# Patient Record
Sex: Female | Born: 1935
Health system: Southern US, Community
[De-identification: ages and names within clinical notes are randomized; demographics above are authoritative.]

## PROBLEM LIST (undated history)

## (undated) DIAGNOSIS — I10 Essential (primary) hypertension: Secondary | ICD-10-CM

## (undated) DIAGNOSIS — K219 Gastro-esophageal reflux disease without esophagitis: Secondary | ICD-10-CM

## (undated) HISTORY — PX: ABDOMINAL HYSTERECTOMY: SHX81

## (undated) HISTORY — PX: FOOT SURGERY: SHX648

---

## 1999-05-13 ENCOUNTER — Ambulatory Visit (HOSPITAL_COMMUNITY): Admission: RE | Admit: 1999-05-13 | Discharge: 1999-05-13 | Payer: Self-pay | Admitting: Internal Medicine

## 1999-05-13 ENCOUNTER — Encounter: Payer: Self-pay | Admitting: Internal Medicine

## 1999-05-14 ENCOUNTER — Encounter: Payer: Self-pay | Admitting: Obstetrics and Gynecology

## 1999-05-14 ENCOUNTER — Ambulatory Visit (HOSPITAL_COMMUNITY): Admission: RE | Admit: 1999-05-14 | Discharge: 1999-05-14 | Payer: Self-pay | Admitting: Obstetrics and Gynecology

## 2000-05-17 ENCOUNTER — Ambulatory Visit (HOSPITAL_COMMUNITY): Admission: RE | Admit: 2000-05-17 | Discharge: 2000-05-17 | Payer: Self-pay | Admitting: Internal Medicine

## 2000-05-17 ENCOUNTER — Encounter: Payer: Self-pay | Admitting: Internal Medicine

## 2000-08-09 ENCOUNTER — Encounter: Payer: Self-pay | Admitting: Internal Medicine

## 2000-08-09 ENCOUNTER — Encounter: Admission: RE | Admit: 2000-08-09 | Discharge: 2000-08-09 | Payer: Self-pay | Admitting: Internal Medicine

## 2003-10-15 ENCOUNTER — Ambulatory Visit (HOSPITAL_COMMUNITY): Admission: RE | Admit: 2003-10-15 | Discharge: 2003-10-15 | Payer: Self-pay | Admitting: Internal Medicine

## 2003-11-13 ENCOUNTER — Ambulatory Visit (HOSPITAL_COMMUNITY): Admission: RE | Admit: 2003-11-13 | Discharge: 2003-11-13 | Payer: Self-pay | Admitting: Internal Medicine

## 2004-01-14 ENCOUNTER — Ambulatory Visit (HOSPITAL_COMMUNITY): Admission: RE | Admit: 2004-01-14 | Discharge: 2004-01-14 | Payer: Self-pay | Admitting: Internal Medicine

## 2004-02-14 ENCOUNTER — Encounter: Admission: RE | Admit: 2004-02-14 | Discharge: 2004-02-14 | Payer: Self-pay | Admitting: Dermatology

## 2004-03-06 ENCOUNTER — Ambulatory Visit (HOSPITAL_COMMUNITY): Admission: RE | Admit: 2004-03-06 | Discharge: 2004-03-06 | Payer: Self-pay | Admitting: Gastroenterology

## 2004-12-08 ENCOUNTER — Ambulatory Visit (HOSPITAL_COMMUNITY): Admission: RE | Admit: 2004-12-08 | Discharge: 2004-12-08 | Payer: Self-pay | Admitting: Internal Medicine

## 2007-09-21 ENCOUNTER — Ambulatory Visit (HOSPITAL_COMMUNITY): Admission: RE | Admit: 2007-09-21 | Discharge: 2007-09-21 | Payer: Self-pay | Admitting: Internal Medicine

## 2008-09-21 ENCOUNTER — Ambulatory Visit (HOSPITAL_COMMUNITY): Admission: RE | Admit: 2008-09-21 | Discharge: 2008-09-21 | Payer: Self-pay | Admitting: Internal Medicine

## 2009-09-26 ENCOUNTER — Ambulatory Visit (HOSPITAL_COMMUNITY): Admission: RE | Admit: 2009-09-26 | Discharge: 2009-09-26 | Payer: Self-pay | Admitting: Internal Medicine

## 2009-11-01 ENCOUNTER — Emergency Department (HOSPITAL_COMMUNITY): Admission: EM | Admit: 2009-11-01 | Discharge: 2009-11-01 | Payer: Self-pay | Admitting: Family Medicine

## 2010-02-24 ENCOUNTER — Encounter: Payer: Self-pay | Admitting: Internal Medicine

## 2010-06-20 NOTE — Op Note (Signed)
NAME:  Kathryn Mann, Kathryn Mann                 ACCOUNT NO.:  [id number]   MEDICAL RECORD NO.:  [id number]          PATIENT TYPE:  AMB   LOCATION:  ENDO                         FACILITY:  [facility]   PHYSICIAN:  Bernette Redbird, M.D.   DATE OF BIRTH:  June 01, 1935   DATE OF PROCEDURE:  03/06/2004  DATE OF DISCHARGE:                                 OPERATIVE REPORT   PROCEDURE:  Colonoscopy.   ENDOSCOPIST:  Bernette Redbird, M.D.   INDICATIONS:  A 75 year old for colon cancer screening. She has had some  transient diarrhea recently but it has resolved.   FINDINGS:  Mild sigmoid diverticulosis and fixation.   PROCEDURE:  The nature, purposes and risks of the procedure have been  discussed with the patient and she provided written consent. Sedation was  Fentanyl 75 mcg and Versed 7.5 mg IV without arrhythmia or desaturation.  The Olympus adjustable tension pediatric video colonoscope was advanced  to the cecum, was identified by visualization of the appendiceal orifice.  Pull back was then performed. The quality of the prep was excellent and it  was felt that all areas were well seen.   The patient had some minimal sigmoid diverticulosis with associated muscular  thickening and stiffness, or fixation of the sigmoid region. In fact, there  was mild to moderate difficulty getting the scope to negotiate that on the  way in, and we had to turn the patient to the supine position to facilitate  advancement.  Otherwise, however, this was a normal examination.  No polyps,  colitis, cancer or vascular malformations were observed. In retroflexion of  the rectum, as well as reinspection of the rectum, this was unremarkable.  No biopsies were obtained.  The patient tolerated the procedure well and  there were no apparent complications.   IMPRESSION:  1.  Unremarkable screening colonoscopy (V76.51).  2.  Mild diverticulosis with associated fixation.   PLAN:  Consider flexible sigmoidoscopy in 5 years for  continued screening.      RB/MEDQ  D:  03/06/2004  T:  03/06/2004  Job:  [id number]   cc:   Wilson Singer, M.D.  [address]  [place]  [place] [address]  Fax: [phone]

## 2010-07-06 ENCOUNTER — Inpatient Hospital Stay (INDEPENDENT_AMBULATORY_CARE_PROVIDER_SITE_OTHER)
Admission: RE | Admit: 2010-07-06 | Discharge: 2010-07-06 | Disposition: A | Discharge status: Home / Self Care | Payer: Medicare Other | Source: Ambulatory Visit | Attending: Emergency Medicine | Admitting: Emergency Medicine

## 2010-07-06 DIAGNOSIS — H6120 Impacted cerumen, unspecified ear: Secondary | ICD-10-CM

## 2010-08-29 ENCOUNTER — Other Ambulatory Visit (HOSPITAL_COMMUNITY): Payer: Self-pay | Admitting: Internal Medicine

## 2010-08-29 DIAGNOSIS — Z1231 Encounter for screening mammogram for malignant neoplasm of breast: Secondary | ICD-10-CM

## 2010-09-29 ENCOUNTER — Ambulatory Visit (HOSPITAL_COMMUNITY): Payer: Medicare Other

## 2010-10-08 ENCOUNTER — Ambulatory Visit (HOSPITAL_COMMUNITY)
Admission: RE | Admit: 2010-10-08 | Discharge: 2010-10-08 | Disposition: A | Discharge status: Home / Self Care | Payer: Medicare Other | Source: Ambulatory Visit | Attending: Internal Medicine | Admitting: Internal Medicine

## 2010-10-08 DIAGNOSIS — Z1231 Encounter for screening mammogram for malignant neoplasm of breast: Secondary | ICD-10-CM

## 2011-03-13 DIAGNOSIS — L570 Actinic keratosis: Secondary | ICD-10-CM | POA: Diagnosis not present

## 2011-03-13 DIAGNOSIS — L578 Other skin changes due to chronic exposure to nonionizing radiation: Secondary | ICD-10-CM | POA: Diagnosis not present

## 2011-04-03 DIAGNOSIS — H04129 Dry eye syndrome of unspecified lacrimal gland: Secondary | ICD-10-CM | POA: Diagnosis not present

## 2011-04-03 DIAGNOSIS — H5200 Hypermetropia, unspecified eye: Secondary | ICD-10-CM | POA: Diagnosis not present

## 2011-04-03 DIAGNOSIS — H52209 Unspecified astigmatism, unspecified eye: Secondary | ICD-10-CM | POA: Diagnosis not present

## 2011-04-03 DIAGNOSIS — H2510 Age-related nuclear cataract, unspecified eye: Secondary | ICD-10-CM | POA: Diagnosis not present

## 2011-04-06 DIAGNOSIS — B029 Zoster without complications: Secondary | ICD-10-CM | POA: Diagnosis not present

## 2011-07-07 DIAGNOSIS — L259 Unspecified contact dermatitis, unspecified cause: Secondary | ICD-10-CM | POA: Diagnosis not present

## 2011-09-11 ENCOUNTER — Other Ambulatory Visit (HOSPITAL_COMMUNITY): Payer: Self-pay | Admitting: Internal Medicine

## 2011-09-11 DIAGNOSIS — Z1231 Encounter for screening mammogram for malignant neoplasm of breast: Secondary | ICD-10-CM

## 2011-09-26 ENCOUNTER — Encounter (HOSPITAL_COMMUNITY): Payer: Self-pay | Admitting: *Deleted

## 2011-09-26 ENCOUNTER — Emergency Department (INDEPENDENT_AMBULATORY_CARE_PROVIDER_SITE_OTHER)
Admission: EM | Admit: 2011-09-26 | Discharge: 2011-09-26 | Disposition: A | Discharge status: Home / Self Care | Payer: Medicare Other | Source: Home / Self Care | Attending: Emergency Medicine | Admitting: Emergency Medicine

## 2011-09-26 DIAGNOSIS — H6090 Unspecified otitis externa, unspecified ear: Secondary | ICD-10-CM

## 2011-09-26 DIAGNOSIS — H6120 Impacted cerumen, unspecified ear: Secondary | ICD-10-CM

## 2011-09-26 HISTORY — DX: Essential (primary) hypertension: I10

## 2011-09-26 HISTORY — DX: Gastro-esophageal reflux disease without esophagitis: K21.9

## 2011-09-26 NOTE — ED Notes (Signed)
Pt states " my ear is plugged up.It doesn't hurtm it's just uncomfortable and it is hard to hear". States that is happened before and wax needed to be cleaned out and that she frequently flys aot when traveling.

## 2011-09-26 NOTE — ED Provider Notes (Signed)
Chief Complaint  Patient presents with  . Otalgia    History of Present Illness:   Kathryn Mann is a 76 year old female who has had right ear pain since last night. She feels ear to be congested. She has had problem with cerumen impactions before. She denies any nasal congestion, rhinorrhea, sore throat, fever, or cough.  Review of Systems:  Other than noted above, the patient denies any of the following symptoms: Systemic:  No fevers, chills, sweats, weight loss or gain, fatigue, or tiredness. Eye:  No redness, pain, discharge, itching, blurred vision, or diplopia. ENT:  No headache, nasal congestion, sneezing, itching, epistaxis, ear pain, congestion, decreased hearing, ringing in ears, vertigo, or tinnitus.  No oral lesions, sore throat, pain on swallowing, or hoarseness. Neck:  No mass, tenderness or adenopathy. Lungs:  No coughing, wheezing, or shortness of breath. Skin:  No rash or itching.  PMFSH:  Past medical history, family history, social history, meds, and allergies were reviewed.  Physical Exam:   Vital signs:  BP 189/98  Pulse 68  Temp 98 F (36.7 C) (Oral)  Resp 18  SpO2 98% General:  Alert and oriented.  In no distress.  Skin warm and dry. Eye:  PERRL, full EOMs, lids and conjunctiva normal.   ENT:  The right ear canal was swollen, red, inflamed. There was a moderate amount of cerumen present. This was irrigated clear. Thereafter the TM appeared normal but the ear canal is still swollen and inflamed. The left ear canal appear normal. There was a moderate amount of cerumen present on the left as well and this was irrigated clear and thereafter the canal TM appeared normal..  Nasal mucosa not congested and without drainage.  Mucous membranes moist, no oral lesions, normal dentition, pharynx clear.  No cranial or facial pain to palplation. Neck:  Supple, full ROM.  No adenopathy, tenderness or mass.  Thyroid normal. Skin:  Clear, warm and dry.  Course in Urgent Care Center:   An  ear wick was inserted and 3 drops of Cortisporin otic suspension were instilled. She was told to continue to use the Cortisporin drops at home, 3 drops 4 times daily and return again as needed.  Assessment:  The primary encounter diagnosis was Cerumen impaction. A diagnosis of Otitis externa was also pertinent to this visit.  Plan:   1.  The following meds were prescribed:   New Prescriptions   No medications on file   2.  The patient was instructed in symptomatic care and handouts were given. 3.  The patient was told to return if becoming worse in any way, if no better in 3 or 4 days, and given some red flag symptoms that would indicate earlier return.  Follow up:  The patient was told to follow up here if no better in 3 or 4 days.       Reuben Likes, MD 09/26/11 [phone]

## 2011-09-28 DIAGNOSIS — H9209 Otalgia, unspecified ear: Secondary | ICD-10-CM | POA: Diagnosis not present

## 2011-09-28 DIAGNOSIS — I10 Essential (primary) hypertension: Secondary | ICD-10-CM | POA: Diagnosis not present

## 2011-09-28 DIAGNOSIS — H6500 Acute serous otitis media, unspecified ear: Secondary | ICD-10-CM | POA: Diagnosis not present

## 2011-09-29 DIAGNOSIS — H9209 Otalgia, unspecified ear: Secondary | ICD-10-CM | POA: Diagnosis not present

## 2011-09-29 DIAGNOSIS — H60399 Other infective otitis externa, unspecified ear: Secondary | ICD-10-CM | POA: Diagnosis not present

## 2011-10-02 DIAGNOSIS — H60399 Other infective otitis externa, unspecified ear: Secondary | ICD-10-CM | POA: Diagnosis not present

## 2011-10-09 ENCOUNTER — Ambulatory Visit (HOSPITAL_COMMUNITY)
Admission: RE | Admit: 2011-10-09 | Discharge: 2011-10-09 | Disposition: A | Discharge status: Home / Self Care | Payer: Medicare Other | Source: Ambulatory Visit | Attending: Internal Medicine | Admitting: Internal Medicine

## 2011-10-09 DIAGNOSIS — Z1231 Encounter for screening mammogram for malignant neoplasm of breast: Secondary | ICD-10-CM | POA: Insufficient documentation

## 2011-12-22 DIAGNOSIS — L819 Disorder of pigmentation, unspecified: Secondary | ICD-10-CM | POA: Diagnosis not present

## 2011-12-22 DIAGNOSIS — L408 Other psoriasis: Secondary | ICD-10-CM | POA: Diagnosis not present

## 2011-12-22 DIAGNOSIS — H6120 Impacted cerumen, unspecified ear: Secondary | ICD-10-CM | POA: Diagnosis not present

## 2012-01-04 DIAGNOSIS — Z23 Encounter for immunization: Secondary | ICD-10-CM | POA: Diagnosis not present

## 2012-06-21 DIAGNOSIS — R82998 Other abnormal findings in urine: Secondary | ICD-10-CM | POA: Diagnosis not present

## 2012-06-21 DIAGNOSIS — R300 Dysuria: Secondary | ICD-10-CM | POA: Diagnosis not present

## 2012-08-03 DIAGNOSIS — M949 Disorder of cartilage, unspecified: Secondary | ICD-10-CM | POA: Diagnosis not present

## 2012-08-03 DIAGNOSIS — E559 Vitamin D deficiency, unspecified: Secondary | ICD-10-CM | POA: Diagnosis not present

## 2012-08-03 DIAGNOSIS — Z1331 Encounter for screening for depression: Secondary | ICD-10-CM | POA: Diagnosis not present

## 2012-08-03 DIAGNOSIS — E785 Hyperlipidemia, unspecified: Secondary | ICD-10-CM | POA: Diagnosis not present

## 2012-08-03 DIAGNOSIS — I10 Essential (primary) hypertension: Secondary | ICD-10-CM | POA: Diagnosis not present

## 2012-08-03 DIAGNOSIS — Z6828 Body mass index (BMI) 28.0-28.9, adult: Secondary | ICD-10-CM | POA: Diagnosis not present

## 2012-10-12 ENCOUNTER — Other Ambulatory Visit (HOSPITAL_COMMUNITY): Payer: Self-pay | Admitting: Internal Medicine

## 2012-10-12 DIAGNOSIS — Z1231 Encounter for screening mammogram for malignant neoplasm of breast: Secondary | ICD-10-CM

## 2012-10-13 ENCOUNTER — Ambulatory Visit (HOSPITAL_COMMUNITY)
Admission: RE | Admit: 2012-10-13 | Discharge: 2012-10-13 | Disposition: A | Discharge status: Home / Self Care | Payer: Medicare Other | Source: Ambulatory Visit | Attending: Internal Medicine | Admitting: Internal Medicine

## 2012-10-13 DIAGNOSIS — Z1231 Encounter for screening mammogram for malignant neoplasm of breast: Secondary | ICD-10-CM | POA: Insufficient documentation

## 2013-01-24 DIAGNOSIS — L02419 Cutaneous abscess of limb, unspecified: Secondary | ICD-10-CM | POA: Diagnosis not present

## 2013-04-21 DIAGNOSIS — H6120 Impacted cerumen, unspecified ear: Secondary | ICD-10-CM | POA: Diagnosis not present

## 2013-07-31 DIAGNOSIS — E785 Hyperlipidemia, unspecified: Secondary | ICD-10-CM | POA: Diagnosis not present

## 2013-07-31 DIAGNOSIS — I10 Essential (primary) hypertension: Secondary | ICD-10-CM | POA: Diagnosis not present

## 2013-07-31 DIAGNOSIS — E559 Vitamin D deficiency, unspecified: Secondary | ICD-10-CM | POA: Diagnosis not present

## 2013-08-01 DIAGNOSIS — R82998 Other abnormal findings in urine: Secondary | ICD-10-CM | POA: Diagnosis not present

## 2013-08-07 DIAGNOSIS — M899 Disorder of bone, unspecified: Secondary | ICD-10-CM | POA: Diagnosis not present

## 2013-08-07 DIAGNOSIS — Z0000 Encounter for general adult medical examination without abnormal findings: Secondary | ICD-10-CM | POA: Diagnosis not present

## 2013-08-07 DIAGNOSIS — N390 Urinary tract infection, site not specified: Secondary | ICD-10-CM | POA: Diagnosis not present

## 2013-08-07 DIAGNOSIS — I10 Essential (primary) hypertension: Secondary | ICD-10-CM | POA: Diagnosis not present

## 2013-08-07 DIAGNOSIS — K219 Gastro-esophageal reflux disease without esophagitis: Secondary | ICD-10-CM | POA: Diagnosis not present

## 2013-08-07 DIAGNOSIS — Z1331 Encounter for screening for depression: Secondary | ICD-10-CM | POA: Diagnosis not present

## 2013-08-07 DIAGNOSIS — E559 Vitamin D deficiency, unspecified: Secondary | ICD-10-CM | POA: Diagnosis not present

## 2013-08-07 DIAGNOSIS — R030 Elevated blood-pressure reading, without diagnosis of hypertension: Secondary | ICD-10-CM | POA: Diagnosis not present

## 2013-08-07 DIAGNOSIS — E785 Hyperlipidemia, unspecified: Secondary | ICD-10-CM | POA: Diagnosis not present

## 2013-08-08 DIAGNOSIS — Z1212 Encounter for screening for malignant neoplasm of rectum: Secondary | ICD-10-CM | POA: Diagnosis not present

## 2013-09-20 DIAGNOSIS — M899 Disorder of bone, unspecified: Secondary | ICD-10-CM | POA: Diagnosis not present

## 2013-09-20 DIAGNOSIS — M949 Disorder of cartilage, unspecified: Secondary | ICD-10-CM | POA: Diagnosis not present

## 2013-09-20 DIAGNOSIS — E559 Vitamin D deficiency, unspecified: Secondary | ICD-10-CM | POA: Diagnosis not present

## 2013-11-06 DIAGNOSIS — Z23 Encounter for immunization: Secondary | ICD-10-CM | POA: Diagnosis not present

## 2014-02-16 DIAGNOSIS — L720 Epidermal cyst: Secondary | ICD-10-CM | POA: Diagnosis not present

## 2014-02-26 ENCOUNTER — Other Ambulatory Visit: Payer: Self-pay | Admitting: Podiatry

## 2014-02-26 ENCOUNTER — Ambulatory Visit (INDEPENDENT_AMBULATORY_CARE_PROVIDER_SITE_OTHER): Payer: Medicare Other | Admitting: Podiatry

## 2014-02-26 ENCOUNTER — Encounter: Payer: Self-pay | Admitting: Podiatry

## 2014-02-26 ENCOUNTER — Ambulatory Visit (INDEPENDENT_AMBULATORY_CARE_PROVIDER_SITE_OTHER): Payer: Medicare Other

## 2014-02-26 VITALS — BP 127/52 | HR 68 | Resp 16

## 2014-02-26 DIAGNOSIS — M779 Enthesopathy, unspecified: Secondary | ICD-10-CM | POA: Diagnosis not present

## 2014-02-26 DIAGNOSIS — M2042 Other hammer toe(s) (acquired), left foot: Secondary | ICD-10-CM | POA: Diagnosis not present

## 2014-02-26 DIAGNOSIS — M216X1 Other acquired deformities of right foot: Secondary | ICD-10-CM

## 2014-02-26 DIAGNOSIS — M2040 Other hammer toe(s) (acquired), unspecified foot: Secondary | ICD-10-CM | POA: Diagnosis not present

## 2014-02-26 NOTE — Progress Notes (Signed)
   Subjective:    Patient ID: Kathryn Mann, female    DOB: Jun 03, 1935, 79 y.o.   MRN: [id number]  HPI Comments: "I have callus that he has looked at before"  Patient c/o tender callused area plantar forefoot right for several years. Sore when walking. Needs it trimmed.     Review of Systems  All other systems reviewed and are negative.      Objective:   Physical Exam        Assessment & Plan:

## 2014-02-26 NOTE — Patient Instructions (Signed)
Pre-Operative Instructions  Congratulations, you have decided to take an important step to improving your quality of life.  You can be assured that the doctors of [facility] will be with you every step of the way.  1. Plan to be at the surgery center/hospital at least 1 (one) hour prior to your scheduled time unless otherwise directed by the surgical center/hospital staff.  You must have a responsible adult accompany you, remain during the surgery and drive you home.  Make sure you have directions to the surgical center/hospital and know how to get there on time. 2. For hospital based surgery you will need to obtain a history and physical form from your family physician within 1 month prior to the date of surgery- we will give you a form for you primary physician.  3. We make every effort to accommodate the date you request for surgery.  There are however, times where surgery dates or times have to be moved.  We will contact you as soon as possible if a change in schedule is required.   4. No Aspirin/Ibuprofen for one week before surgery.  If you are on aspirin, any non-steroidal anti-inflammatory medications (Mobic, Aleve, Ibuprofen) you should stop taking it 7 days prior to your surgery.  You make take Tylenol  For pain prior to surgery.  5. Medications- If you are taking daily heart and blood pressure medications, seizure, reflux, allergy, asthma, anxiety, pain or diabetes medications, make sure the surgery center/hospital is aware before the day of surgery so they may notify you which medications to take or avoid the day of surgery. 6. No food or drink after midnight the night before surgery unless directed otherwise by surgical center/hospital staff. 7. No alcoholic beverages 24 hours prior to surgery.  No smoking 24 hours prior to or 24 hours after surgery. 8. Wear loose pants or shorts- loose enough to fit over bandages, boots, and casts. 9. No slip on shoes, sneakers are best. 10. Bring  your boot with you to the surgery center/hospital.  Also bring crutches or a walker if your physician has prescribed it for you.  If you do not have this equipment, it will be provided for you after surgery. 11. If you have not been contracted by the surgery center/hospital by the day before your surgery, call to confirm the date and time of your surgery. 12. Leave-time from work may vary depending on the type of surgery you have.  Appropriate arrangements should be made prior to surgery with your employer. 13. Prescriptions will be provided immediately following surgery by your doctor.  Have these filled as soon as possible after surgery and take the medication as directed. 14. Remove nail polish on the operative foot. 15. Wash the night before surgery.  The night before surgery wash the foot and leg well with the antibacterial soap provided and water paying special attention to beneath the toenails and in between the toes.  Rinse thoroughly with water and dry well with a towel.  Perform this wash unless told not to do so by your physician.  Enclosed: 1 Ice pack (please put in freezer the night before surgery)   1 Hibiclens skin cleaner   Pre-op Instructions  If you have any questions regarding the instructions, do not hesitate to call our office.  [place]: [address] [place], [place] [address] [phone]  [place]: [address] [place], [place] [address] [phone]  [place]: [address]  [place],  [address] [phone]  Dr. Richard   Tuchman DPM, Dr. Norman Regal DPM Dr. Richard Sikora DPM, Dr. M. Todd Hyatt DPM, Dr. Kathryn Egerton DPM 

## 2014-02-28 NOTE — Progress Notes (Signed)
Subjective:     Patient ID: Kathryn Mann, female   DOB: July 20, 1935, 79 y.o.   MRN: [id number]  HPI patient states this right foot is still killing me and I feel like I'm walking directly on the bone and I'm having trouble being able to ambulate or do any types of physical activity or wear shoe gear comfortably   Review of Systems  All other systems reviewed and are negative.      Objective:   Physical Exam  Constitutional: She is oriented to person, place, and time.  Cardiovascular: Intact distal pulses.   Musculoskeletal: Normal range of motion.  Neurological: She is oriented to person, place, and time.  Skin: Skin is warm.  Nursing note and vitals reviewed.  neurovascular status intact with muscle strength adequate and range of motion of the subtalar midtarsal joint within normal limits. Patient has extensive forefoot surgery and is noted to have mild medial dislocation of the left hallux and incision sites were bunion some corrected on both feet along with digital corrections. She is found to have a very prominent third metatarsal right that's very tender when pressed with a dorsally dislocated third toe and does have on the fifth metatarsal a prominent area and redness around the fifth metatarsal head that's painful when pressed difficult. 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 At this point I did discuss the difficult dislocation of the third MPJ and the fact that the bone is being directly plantarflexed secondary to structure. I reviewed different treatment options  and she wants and aggressive treatment to try to get this better and I discussed relocating the third digit and shortening the third metatarsal with the fact that due to the extreme deformity that most likely will not be able to get complete relocation of the toe. Also have recommended correcting the fifth metatarsal and did discuss again that this is a difficult case and there is no guarantee as to what kind results we can achieve. Patient wants to do surgery and at this time I allowed her to read a consent form for correction explaining all possible complications as outlined and the fact there is no long-term guarantees. She wants surgery and signs consent form after extensive review and is scheduled for outpatient surgery with a short air fracture walker dispensed today for the postoperative period that I want her to get used to prior to surgery. She is given all preoperative instructions and is encouraged to call with any questions that may come up prior to procedure

## 2014-03-21 ENCOUNTER — Ambulatory Visit (INDEPENDENT_AMBULATORY_CARE_PROVIDER_SITE_OTHER): Payer: Medicare Other | Admitting: Podiatry

## 2014-03-21 ENCOUNTER — Encounter: Payer: Self-pay | Admitting: Podiatry

## 2014-03-21 DIAGNOSIS — M779 Enthesopathy, unspecified: Secondary | ICD-10-CM | POA: Diagnosis not present

## 2014-03-21 DIAGNOSIS — M2040 Other hammer toe(s) (acquired), unspecified foot: Secondary | ICD-10-CM

## 2014-03-21 DIAGNOSIS — M216X1 Other acquired deformities of right foot: Secondary | ICD-10-CM

## 2014-03-21 NOTE — Progress Notes (Signed)
Subjective:     Patient ID: Kathryn Mann, female   DOB: 1936/01/29, 79 y.o.   MRN: [id number]  HPI patient states I want to hold off on fixing my left foot and I just wanted to the right one and I wanted to review what work in a do and get my questions answered   Review of Systems     Objective:   Physical Exam Neurovascular status unchanged with patient healthy history being good and no changes noted. Patient's digits are well-perfused and she's well oriented 3 and is found to have severe plantar lesion third metatarsal right with elevation of the digit and large hyperostosis fifth metatarsal right with digital deformity second left    Assessment:     Structural deformities noted as we had noted prior    Plan:     Reviewed condition at great length and I explained again the difficulty of lengthening the second toe and reducing the dislocation of the cyst third metatarsal phalangeal joint. At this time she wants surgery and is again as scheduled and reviewed and also understand metatarsal osteotomy fifth right and a one point in the future we will fix the left foot

## 2014-03-27 ENCOUNTER — Encounter: Payer: Self-pay | Admitting: Podiatry

## 2014-03-27 DIAGNOSIS — M216X1 Other acquired deformities of right foot: Secondary | ICD-10-CM | POA: Diagnosis not present

## 2014-03-27 DIAGNOSIS — M205X1 Other deformities of toe(s) (acquired), right foot: Secondary | ICD-10-CM | POA: Diagnosis not present

## 2014-03-27 DIAGNOSIS — M2041 Other hammer toe(s) (acquired), right foot: Secondary | ICD-10-CM | POA: Diagnosis not present

## 2014-03-27 DIAGNOSIS — M21541 Acquired clubfoot, right foot: Secondary | ICD-10-CM | POA: Diagnosis not present

## 2014-03-27 DIAGNOSIS — M2011 Hallux valgus (acquired), right foot: Secondary | ICD-10-CM | POA: Diagnosis not present

## 2014-03-27 DIAGNOSIS — I10 Essential (primary) hypertension: Secondary | ICD-10-CM | POA: Diagnosis not present

## 2014-03-27 DIAGNOSIS — M21549 Acquired clubfoot, unspecified foot: Secondary | ICD-10-CM | POA: Diagnosis not present

## 2014-03-27 DIAGNOSIS — M2010 Hallux valgus (acquired), unspecified foot: Secondary | ICD-10-CM | POA: Diagnosis not present

## 2014-04-02 ENCOUNTER — Ambulatory Visit (INDEPENDENT_AMBULATORY_CARE_PROVIDER_SITE_OTHER): Payer: Medicare Other

## 2014-04-02 ENCOUNTER — Ambulatory Visit (INDEPENDENT_AMBULATORY_CARE_PROVIDER_SITE_OTHER): Payer: Medicare Other | Admitting: Podiatry

## 2014-04-02 DIAGNOSIS — M2040 Other hammer toe(s) (acquired), unspecified foot: Secondary | ICD-10-CM

## 2014-04-02 DIAGNOSIS — M216X1 Other acquired deformities of right foot: Secondary | ICD-10-CM

## 2014-04-02 DIAGNOSIS — Z9889 Other specified postprocedural states: Secondary | ICD-10-CM | POA: Diagnosis not present

## 2014-04-03 NOTE — Progress Notes (Signed)
Subjective:     Patient ID: Kathryn Mann, female   DOB: [birth year], 79 y.o.   MRN: [id number]  HPI patient states my right foot feels real good with minimal swelling or pain and I'm able to walk without any problems currently   Review of Systems     Objective:   Physical Exam Neurovascular status intact with muscle strength adequate negative Homans sign noted and wound edges that are well coapted with good alignment and pin in place  third toe    Assessment:     Right foot that's doing well with pin in place and no indications of problems currently    Plan:     X-ray reviewed and advised on continued immobilization elevation and compression. Reappoint 2 weeks for suture removal or earlier if any issues should occur

## 2014-04-11 DIAGNOSIS — M79673 Pain in unspecified foot: Secondary | ICD-10-CM

## 2014-04-20 ENCOUNTER — Ambulatory Visit (INDEPENDENT_AMBULATORY_CARE_PROVIDER_SITE_OTHER): Payer: Medicare Other | Admitting: Podiatrist

## 2014-04-20 ENCOUNTER — Encounter: Payer: Self-pay | Admitting: Podiatrist

## 2014-04-20 VITALS — BP 132/89 | HR 69 | Resp 12

## 2014-04-20 DIAGNOSIS — Z9889 Other specified postprocedural states: Secondary | ICD-10-CM

## 2014-04-20 NOTE — Progress Notes (Signed)
Patient presents today in post op time period due to losing the protective ball/cap that covers the kwire.  She relates her foot is doing well but she is concerned her wire will catch on something due to the cap being gone.  Good post op progress is noted.  Elastoplast tape is applied to the end of the wire and a dressing is applied over the end of the wire.    She will be seen back for her routine post op appointment with her surgeon Dr. Paulla Dolly.

## 2014-04-25 NOTE — Progress Notes (Signed)
DOS 03/27/2014 Shortening plantar flexed 3rd metatarsal right, Tailors Bunion with screw 5th metatarsal right, Fusion with pin 3rd toe right foot.

## 2014-04-30 ENCOUNTER — Ambulatory Visit (INDEPENDENT_AMBULATORY_CARE_PROVIDER_SITE_OTHER): Payer: Medicare Other | Admitting: Podiatry

## 2014-04-30 ENCOUNTER — Ambulatory Visit (INDEPENDENT_AMBULATORY_CARE_PROVIDER_SITE_OTHER): Payer: Medicare Other

## 2014-04-30 VITALS — BP 191/98 | HR 67 | Resp 16

## 2014-04-30 DIAGNOSIS — Z9889 Other specified postprocedural states: Secondary | ICD-10-CM

## 2014-04-30 DIAGNOSIS — M2041 Other hammer toe(s) (acquired), right foot: Secondary | ICD-10-CM | POA: Diagnosis not present

## 2014-04-30 NOTE — Progress Notes (Signed)
Subjective:     Patient ID: Kathryn Mann, female   DOB: Jul 08, 1935, 79 y.o.   MRN: [id number]  HPI patient presents stating I'm doing very well with my surgery and ready get this pin taken out   Review of Systems     Objective:   Physical Exam Neurovascular status intact with pin in place third toe right with well-healing surgical sites and wound edges well coapted with normal edema for this. Postop    Assessment:     Doing well post foot reconstruction right    Plan:     Pin removed third toe right with sterile dressing applied and instructed on physical therapy and reviewed x-rays and gradual return soft shoes

## 2014-06-16 ENCOUNTER — Emergency Department (INDEPENDENT_AMBULATORY_CARE_PROVIDER_SITE_OTHER)
Admission: EM | Admit: 2014-06-16 | Discharge: 2014-06-16 | Disposition: A | Discharge status: Home / Self Care | Payer: Medicare Other | Source: Home / Self Care | Attending: Emergency Medicine | Admitting: Emergency Medicine

## 2014-06-16 ENCOUNTER — Encounter (HOSPITAL_COMMUNITY): Payer: Self-pay | Admitting: *Deleted

## 2014-06-16 DIAGNOSIS — T148 Other injury of unspecified body region: Secondary | ICD-10-CM | POA: Diagnosis not present

## 2014-06-16 DIAGNOSIS — L03012 Cellulitis of left finger: Secondary | ICD-10-CM

## 2014-06-16 DIAGNOSIS — W57XXXA Bitten or stung by nonvenomous insect and other nonvenomous arthropods, initial encounter: Secondary | ICD-10-CM | POA: Diagnosis not present

## 2014-06-16 MED ORDER — SULFAMETHOXAZOLE-TRIMETHOPRIM 800-160 MG PO TABS: 1.0000 | ORAL_TABLET | Freq: Two times a day (BID) | ORAL | Status: DC

## 2014-06-16 MED ORDER — SULFAMETHOXAZOLE-TRIMETHOPRIM 800-160 MG PO TABS: 1.0000 | ORAL_TABLET | Freq: Two times a day (BID) | ORAL | Status: AC

## 2014-06-16 NOTE — ED Notes (Signed)
Pt  Reports     Possible        Insect bite  To  l  Hand      sev  Days  Ago       While  Working in the  [employer]  She  Has  Pain   -  Swelling of  The  Affected  Hand  Which  Became  Worse  Today                she  Has  Been  Using  Benadryl /    and  Hydrocortisone  Cream  Without  Substantial  releif

## 2014-06-16 NOTE — Discharge Instructions (Signed)
I think you were bitten by a spider have an infection. Take Bactrim 1 pill twice a day for 10 days. Keep ice on the area as much as possible. If there is no improvement in 2 days or the redness is spreading up her arm, please go to the emergency room.

## 2014-06-16 NOTE — ED Provider Notes (Signed)
CSN: [id number]     Arrival date & time 06/16/14  1438 History   First MD Initiated Contact with Patient 06/16/14 1628     Chief Complaint  Patient presents with  . Insect Bite   (Consider location/radiation/quality/duration/timing/severity/associated sxs/prior Treatment) HPI She is a 79 year old woman here for evaluation of left hand pain. She states she was working in her garden 2 days ago and got bit by something on her left middle finger. She has had gradually worsening swelling and redness. It is now involving the distal aspect of her dorsal third and fourth metatarsals. She denies any fevers or chills. She has tried ice, Benadryl, hydrocortisone cream with out improvement.  Past Medical History  Diagnosis Date  . Hypertension   . GERD (gastroesophageal reflux disease)    Past Surgical History  Procedure Laterality Date  . Foot surgery      right foot surgery  . Abdominal hysterectomy     Family History  Problem Relation Age of Onset  . Cancer Father    History  Substance Use Topics  . Smoking status: Never Smoker   . Smokeless tobacco: Not on file  . Alcohol Use: Yes     Comment: socially   OB History    No data available     Review of Systems As in history of present illness Allergies  Penicillins; Red dye; and Yellow dyes (non-tartrazine)  Home Medications   Prior to Admission medications   Medication Sig Start Date End Date Taking? Authorizing Provider  aspirin 81 MG tablet Take 81 mg by mouth daily.    Historical Provider, MD  calcium-vitamin D (OSCAL WITH D) 250-125 MG-UNIT per tablet Take 1 tablet by mouth daily.    Historical Provider, MD  cholecalciferol (VITAMIN D) 1000 UNITS tablet Take 1,000 Units by mouth once a week.    Historical Provider, MD  hydrochlorothiazide (HYDRODIURIL) 25 MG tablet Take 25 mg by mouth daily.    Historical Provider, MD  meperidine (DEMEROL) 50 MG tablet  03/27/14   Historical Provider, MD  omeprazole (PRILOSEC) 20 MG  capsule Take 20 mg by mouth daily.    Historical Provider, MD  promethazine (PHENERGAN) 25 MG tablet  03/27/14   Historical Provider, MD  sulfamethoxazole-trimethoprim (BACTRIM DS,SEPTRA DS) 800-160 MG per tablet Take 1 tablet by mouth 2 (two) times daily. 06/16/14 06/23/14  Melony Overly, MD   BP 203/115 mmHg  Pulse 69  Temp(Src) 98.3 F (36.8 C) (Oral)  Resp 16  SpO2 100% Physical Exam  Constitutional: She is oriented to person, place, and time. She appears well-developed and well-nourished. No distress.  Cardiovascular: Normal rate.   Pulmonary/Chest: Effort normal.  Musculoskeletal:  Left hand: Middle finger is erythematous and swollen with a small blister just distal to the PIP joint.  The redness and swelling extends over the dorsal aspects of the third and fourth metatarsals.  Neurological: She is alert and oriented to person, place, and time.    ED Course  Procedures (including critical care time) Labs Review Labs Reviewed - No data to display  Imaging Review No results found.   MDM   1. Insect bite   2. Cellulitis of finger of left hand    We'll treat with Bactrim for 10 days. Recommended continued icing. Return precautions reviewed.    Melony Overly, MD 06/16/14 [phone]

## 2014-08-07 DIAGNOSIS — I10 Essential (primary) hypertension: Secondary | ICD-10-CM | POA: Diagnosis not present

## 2014-08-07 DIAGNOSIS — E785 Hyperlipidemia, unspecified: Secondary | ICD-10-CM | POA: Diagnosis not present

## 2014-08-07 DIAGNOSIS — E559 Vitamin D deficiency, unspecified: Secondary | ICD-10-CM | POA: Diagnosis not present

## 2014-08-08 DIAGNOSIS — Z1212 Encounter for screening for malignant neoplasm of rectum: Secondary | ICD-10-CM | POA: Diagnosis not present

## 2014-08-30 DIAGNOSIS — K5730 Diverticulosis of large intestine without perforation or abscess without bleeding: Secondary | ICD-10-CM | POA: Diagnosis not present

## 2014-08-30 DIAGNOSIS — Z1211 Encounter for screening for malignant neoplasm of colon: Secondary | ICD-10-CM | POA: Diagnosis not present

## 2014-09-21 DIAGNOSIS — R030 Elevated blood-pressure reading, without diagnosis of hypertension: Secondary | ICD-10-CM | POA: Diagnosis not present

## 2014-09-21 DIAGNOSIS — Z6828 Body mass index (BMI) 28.0-28.9, adult: Secondary | ICD-10-CM | POA: Diagnosis not present

## 2014-09-21 DIAGNOSIS — K219 Gastro-esophageal reflux disease without esophagitis: Secondary | ICD-10-CM | POA: Diagnosis not present

## 2014-10-16 DIAGNOSIS — H3531 Nonexudative age-related macular degeneration: Secondary | ICD-10-CM | POA: Diagnosis not present

## 2014-10-16 DIAGNOSIS — H43813 Vitreous degeneration, bilateral: Secondary | ICD-10-CM | POA: Diagnosis not present

## 2014-10-16 DIAGNOSIS — H2513 Age-related nuclear cataract, bilateral: Secondary | ICD-10-CM | POA: Diagnosis not present

## 2014-10-16 DIAGNOSIS — H524 Presbyopia: Secondary | ICD-10-CM | POA: Diagnosis not present

## 2014-11-27 DIAGNOSIS — M25562 Pain in left knee: Secondary | ICD-10-CM | POA: Diagnosis not present

## 2014-12-18 DIAGNOSIS — M25562 Pain in left knee: Secondary | ICD-10-CM | POA: Diagnosis not present

## 2015-04-16 ENCOUNTER — Other Ambulatory Visit: Payer: Self-pay

## 2015-04-16 DIAGNOSIS — Z1231 Encounter for screening mammogram for malignant neoplasm of breast: Secondary | ICD-10-CM

## 2015-04-23 ENCOUNTER — Ambulatory Visit
Admission: RE | Admit: 2015-04-23 | Discharge: 2015-04-23 | Disposition: A | Discharge status: Home / Self Care | Payer: Medicare Other | Source: Ambulatory Visit

## 2015-04-23 ENCOUNTER — Other Ambulatory Visit: Payer: Self-pay

## 2015-04-23 DIAGNOSIS — Z1231 Encounter for screening mammogram for malignant neoplasm of breast: Secondary | ICD-10-CM | POA: Diagnosis not present

## 2015-04-24 ENCOUNTER — Other Ambulatory Visit: Payer: Self-pay | Admitting: Internal Medicine

## 2015-04-24 DIAGNOSIS — R928 Other abnormal and inconclusive findings on diagnostic imaging of breast: Secondary | ICD-10-CM

## 2015-04-29 ENCOUNTER — Other Ambulatory Visit: Payer: Self-pay | Admitting: Internal Medicine

## 2015-04-29 ENCOUNTER — Ambulatory Visit
Admission: RE | Admit: 2015-04-29 | Discharge: 2015-04-29 | Disposition: A | Discharge status: Home / Self Care | Payer: Medicare Other | Source: Ambulatory Visit | Attending: Internal Medicine | Admitting: Internal Medicine

## 2015-04-29 DIAGNOSIS — R928 Other abnormal and inconclusive findings on diagnostic imaging of breast: Secondary | ICD-10-CM

## 2015-04-29 DIAGNOSIS — N63 Unspecified lump in breast: Secondary | ICD-10-CM | POA: Diagnosis not present

## 2015-04-29 DIAGNOSIS — R921 Mammographic calcification found on diagnostic imaging of breast: Secondary | ICD-10-CM | POA: Diagnosis not present

## 2015-04-30 ENCOUNTER — Other Ambulatory Visit: Payer: Medicare Other

## 2015-05-02 ENCOUNTER — Other Ambulatory Visit: Payer: Self-pay | Admitting: Internal Medicine

## 2015-05-02 DIAGNOSIS — R928 Other abnormal and inconclusive findings on diagnostic imaging of breast: Secondary | ICD-10-CM

## 2015-05-03 ENCOUNTER — Ambulatory Visit
Admission: RE | Admit: 2015-05-03 | Discharge: 2015-05-03 | Disposition: A | Discharge status: Home / Self Care | Payer: Medicare Other | Source: Ambulatory Visit | Attending: Internal Medicine | Admitting: Internal Medicine

## 2015-05-03 DIAGNOSIS — N63 Unspecified lump in breast: Secondary | ICD-10-CM | POA: Diagnosis not present

## 2015-05-03 DIAGNOSIS — N6032 Fibrosclerosis of left breast: Secondary | ICD-10-CM | POA: Diagnosis not present

## 2015-05-03 DIAGNOSIS — R928 Other abnormal and inconclusive findings on diagnostic imaging of breast: Secondary | ICD-10-CM

## 2015-07-02 DIAGNOSIS — S299XXA Unspecified injury of thorax, initial encounter: Secondary | ICD-10-CM | POA: Diagnosis not present

## 2015-07-02 DIAGNOSIS — S21002A Unspecified open wound of left breast, initial encounter: Secondary | ICD-10-CM | POA: Diagnosis not present

## 2015-07-04 DIAGNOSIS — S21002D Unspecified open wound of left breast, subsequent encounter: Secondary | ICD-10-CM | POA: Diagnosis not present

## 2015-07-04 DIAGNOSIS — S21002A Unspecified open wound of left breast, initial encounter: Secondary | ICD-10-CM

## 2015-07-04 HISTORY — DX: Unspecified open wound of left breast, initial encounter: S21.002A

## 2015-07-08 DIAGNOSIS — S21002D Unspecified open wound of left breast, subsequent encounter: Secondary | ICD-10-CM | POA: Diagnosis not present

## 2015-08-12 DIAGNOSIS — S21002D Unspecified open wound of left breast, subsequent encounter: Secondary | ICD-10-CM | POA: Diagnosis not present

## 2015-08-28 DIAGNOSIS — S21002D Unspecified open wound of left breast, subsequent encounter: Secondary | ICD-10-CM | POA: Diagnosis not present

## 2015-09-04 DIAGNOSIS — Z1389 Encounter for screening for other disorder: Secondary | ICD-10-CM | POA: Diagnosis not present

## 2015-09-04 DIAGNOSIS — H6123 Impacted cerumen, bilateral: Secondary | ICD-10-CM | POA: Insufficient documentation

## 2015-09-04 DIAGNOSIS — H93292 Other abnormal auditory perceptions, left ear: Secondary | ICD-10-CM | POA: Diagnosis not present

## 2015-09-04 DIAGNOSIS — Z6828 Body mass index (BMI) 28.0-28.9, adult: Secondary | ICD-10-CM | POA: Diagnosis not present

## 2015-09-04 DIAGNOSIS — I10 Essential (primary) hypertension: Secondary | ICD-10-CM | POA: Diagnosis not present

## 2015-09-04 DIAGNOSIS — R030 Elevated blood-pressure reading, without diagnosis of hypertension: Secondary | ICD-10-CM | POA: Diagnosis not present

## 2015-09-05 DIAGNOSIS — I10 Essential (primary) hypertension: Secondary | ICD-10-CM | POA: Diagnosis not present

## 2015-09-05 DIAGNOSIS — E784 Other hyperlipidemia: Secondary | ICD-10-CM | POA: Diagnosis not present

## 2015-09-05 DIAGNOSIS — M859 Disorder of bone density and structure, unspecified: Secondary | ICD-10-CM | POA: Diagnosis not present

## 2015-09-12 DIAGNOSIS — E559 Vitamin D deficiency, unspecified: Secondary | ICD-10-CM | POA: Diagnosis not present

## 2015-09-12 DIAGNOSIS — E784 Other hyperlipidemia: Secondary | ICD-10-CM | POA: Diagnosis not present

## 2015-09-12 DIAGNOSIS — R030 Elevated blood-pressure reading, without diagnosis of hypertension: Secondary | ICD-10-CM | POA: Diagnosis not present

## 2015-09-12 DIAGNOSIS — M898X1 Other specified disorders of bone, shoulder: Secondary | ICD-10-CM | POA: Diagnosis not present

## 2015-09-12 DIAGNOSIS — I868 Varicose veins of other specified sites: Secondary | ICD-10-CM | POA: Diagnosis not present

## 2015-09-12 DIAGNOSIS — Z0000 Encounter for general adult medical examination without abnormal findings: Secondary | ICD-10-CM | POA: Diagnosis not present

## 2015-09-12 DIAGNOSIS — K219 Gastro-esophageal reflux disease without esophagitis: Secondary | ICD-10-CM | POA: Diagnosis not present

## 2015-09-12 DIAGNOSIS — I10 Essential (primary) hypertension: Secondary | ICD-10-CM | POA: Diagnosis not present

## 2015-09-12 DIAGNOSIS — M859 Disorder of bone density and structure, unspecified: Secondary | ICD-10-CM | POA: Diagnosis not present

## 2015-09-12 DIAGNOSIS — Z6828 Body mass index (BMI) 28.0-28.9, adult: Secondary | ICD-10-CM | POA: Diagnosis not present

## 2015-09-25 ENCOUNTER — Other Ambulatory Visit: Payer: Self-pay

## 2015-10-10 DIAGNOSIS — S8011XA Contusion of right lower leg, initial encounter: Secondary | ICD-10-CM | POA: Diagnosis not present

## 2015-10-10 DIAGNOSIS — S8012XA Contusion of left lower leg, initial encounter: Secondary | ICD-10-CM | POA: Diagnosis not present

## 2015-10-18 DIAGNOSIS — S8011XD Contusion of right lower leg, subsequent encounter: Secondary | ICD-10-CM | POA: Diagnosis not present

## 2015-10-18 DIAGNOSIS — S8012XD Contusion of left lower leg, subsequent encounter: Secondary | ICD-10-CM | POA: Diagnosis not present

## 2015-10-21 ENCOUNTER — Ambulatory Visit (INDEPENDENT_AMBULATORY_CARE_PROVIDER_SITE_OTHER): Payer: Self-pay

## 2015-10-21 ENCOUNTER — Encounter (HOSPITAL_COMMUNITY): Payer: Self-pay | Admitting: Emergency Medicine

## 2015-10-21 ENCOUNTER — Ambulatory Visit (HOSPITAL_COMMUNITY)
Admission: EM | Admit: 2015-10-21 | Discharge: 2015-10-21 | Disposition: A | Discharge status: Home / Self Care | Payer: Medicare Other | Attending: Emergency Medicine | Admitting: Emergency Medicine

## 2015-10-21 DIAGNOSIS — R0602 Shortness of breath: Secondary | ICD-10-CM | POA: Diagnosis not present

## 2015-10-21 DIAGNOSIS — S20211A Contusion of right front wall of thorax, initial encounter: Secondary | ICD-10-CM | POA: Diagnosis not present
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
## 2015-11-07 DIAGNOSIS — S8012XD Contusion of left lower leg, subsequent encounter: Secondary | ICD-10-CM | POA: Diagnosis not present

## 2015-11-07 DIAGNOSIS — S8011XD Contusion of right lower leg, subsequent encounter: Secondary | ICD-10-CM | POA: Diagnosis not present

## 2015-11-22 DIAGNOSIS — Z23 Encounter for immunization: Secondary | ICD-10-CM | POA: Diagnosis not present

## 2015-12-23 DIAGNOSIS — S8012XD Contusion of left lower leg, subsequent encounter: Secondary | ICD-10-CM | POA: Diagnosis not present

## 2015-12-23 DIAGNOSIS — M25512 Pain in left shoulder: Secondary | ICD-10-CM | POA: Diagnosis not present

## 2015-12-23 DIAGNOSIS — S8011XD Contusion of right lower leg, subsequent encounter: Secondary | ICD-10-CM | POA: Diagnosis not present

## 2016-01-03 DIAGNOSIS — M25512 Pain in left shoulder: Secondary | ICD-10-CM | POA: Diagnosis not present

## 2016-01-10 DIAGNOSIS — M19012 Primary osteoarthritis, left shoulder: Secondary | ICD-10-CM | POA: Diagnosis not present

## 2016-01-10 DIAGNOSIS — M7062 Trochanteric bursitis, left hip: Secondary | ICD-10-CM | POA: Diagnosis not present

## 2016-01-20 DIAGNOSIS — M25612 Stiffness of left shoulder, not elsewhere classified: Secondary | ICD-10-CM | POA: Diagnosis not present

## 2016-01-23 DIAGNOSIS — M25612 Stiffness of left shoulder, not elsewhere classified: Secondary | ICD-10-CM | POA: Diagnosis not present

## 2016-02-05 DIAGNOSIS — M25612 Stiffness of left shoulder, not elsewhere classified: Secondary | ICD-10-CM | POA: Diagnosis not present

## 2016-02-06 DIAGNOSIS — H52203 Unspecified astigmatism, bilateral: Secondary | ICD-10-CM | POA: Diagnosis not present

## 2016-02-06 DIAGNOSIS — H353131 Nonexudative age-related macular degeneration, bilateral, early dry stage: Secondary | ICD-10-CM | POA: Diagnosis not present

## 2016-02-06 DIAGNOSIS — H2513 Age-related nuclear cataract, bilateral: Secondary | ICD-10-CM | POA: Diagnosis not present

## 2016-02-13 DIAGNOSIS — M25612 Stiffness of left shoulder, not elsewhere classified: Secondary | ICD-10-CM | POA: Diagnosis not present

## 2016-02-21 DIAGNOSIS — M19012 Primary osteoarthritis, left shoulder: Secondary | ICD-10-CM | POA: Diagnosis not present

## 2016-02-21 DIAGNOSIS — M25612 Stiffness of left shoulder, not elsewhere classified: Secondary | ICD-10-CM | POA: Diagnosis not present

## 2016-04-07 DIAGNOSIS — R399 Unspecified symptoms and signs involving the genitourinary system: Secondary | ICD-10-CM | POA: Diagnosis not present

## 2016-04-07 DIAGNOSIS — R300 Dysuria: Secondary | ICD-10-CM | POA: Diagnosis not present

## 2016-04-28 DIAGNOSIS — A499 Bacterial infection, unspecified: Secondary | ICD-10-CM | POA: Diagnosis not present

## 2016-04-28 DIAGNOSIS — N390 Urinary tract infection, site not specified: Secondary | ICD-10-CM | POA: Diagnosis not present

## 2016-07-31 DIAGNOSIS — L300 Nummular dermatitis: Secondary | ICD-10-CM | POA: Diagnosis not present

## 2016-07-31 DIAGNOSIS — L821 Other seborrheic keratosis: Secondary | ICD-10-CM | POA: Diagnosis not present

## 2016-08-12 DIAGNOSIS — L821 Other seborrheic keratosis: Secondary | ICD-10-CM | POA: Diagnosis not present

## 2016-08-12 DIAGNOSIS — L308 Other specified dermatitis: Secondary | ICD-10-CM | POA: Diagnosis not present

## 2016-08-12 DIAGNOSIS — D1801 Hemangioma of skin and subcutaneous tissue: Secondary | ICD-10-CM | POA: Diagnosis not present

## 2016-08-12 DIAGNOSIS — L814 Other melanin hyperpigmentation: Secondary | ICD-10-CM | POA: Diagnosis not present

## 2016-09-14 DIAGNOSIS — M859 Disorder of bone density and structure, unspecified: Secondary | ICD-10-CM | POA: Diagnosis not present

## 2016-09-14 DIAGNOSIS — I10 Essential (primary) hypertension: Secondary | ICD-10-CM | POA: Diagnosis not present

## 2016-09-14 DIAGNOSIS — E784 Other hyperlipidemia: Secondary | ICD-10-CM | POA: Diagnosis not present

## 2016-09-21 DIAGNOSIS — E784 Other hyperlipidemia: Secondary | ICD-10-CM | POA: Diagnosis not present

## 2016-09-21 DIAGNOSIS — I10 Essential (primary) hypertension: Secondary | ICD-10-CM | POA: Diagnosis not present

## 2016-09-21 DIAGNOSIS — R030 Elevated blood-pressure reading, without diagnosis of hypertension: Secondary | ICD-10-CM | POA: Diagnosis not present

## 2016-09-21 DIAGNOSIS — Z0000 Encounter for general adult medical examination without abnormal findings: Secondary | ICD-10-CM | POA: Diagnosis not present

## 2016-09-21 DIAGNOSIS — M859 Disorder of bone density and structure, unspecified: Secondary | ICD-10-CM | POA: Diagnosis not present

## 2016-09-21 DIAGNOSIS — K219 Gastro-esophageal reflux disease without esophagitis: Secondary | ICD-10-CM | POA: Diagnosis not present

## 2016-09-21 DIAGNOSIS — M898X1 Other specified disorders of bone, shoulder: Secondary | ICD-10-CM | POA: Diagnosis not present

## 2016-09-21 DIAGNOSIS — Z1389 Encounter for screening for other disorder: Secondary | ICD-10-CM | POA: Diagnosis not present

## 2016-09-21 DIAGNOSIS — Z6829 Body mass index (BMI) 29.0-29.9, adult: Secondary | ICD-10-CM | POA: Diagnosis not present

## 2016-09-21 DIAGNOSIS — E559 Vitamin D deficiency, unspecified: Secondary | ICD-10-CM | POA: Diagnosis not present

## 2016-09-21 DIAGNOSIS — I868 Varicose veins of other specified sites: Secondary | ICD-10-CM | POA: Diagnosis not present

## 2016-09-22 DIAGNOSIS — Z23 Encounter for immunization: Secondary | ICD-10-CM | POA: Diagnosis not present

## 2016-09-28 DIAGNOSIS — Z1212 Encounter for screening for malignant neoplasm of rectum: Secondary | ICD-10-CM | POA: Diagnosis not present

## 2016-11-26 DIAGNOSIS — M545 Low back pain: Secondary | ICD-10-CM | POA: Diagnosis not present

## 2016-12-18 DIAGNOSIS — Z23 Encounter for immunization: Secondary | ICD-10-CM | POA: Diagnosis not present

## 2017-01-20 IMAGING — MG MM DIGITAL DIAGNOSTIC UNILAT*L*
1 series · 1 of 1 positions shown · non-contrast
Comparison: Previous exam(s).

ADDENDUM:
Patient returns today for evaluation due to a persistent wound at
site of a benign left breast biopsy on 05/03/2015. The patient
states this wound is larger and continues to seep clear yellow
fluid. The patient states pain occurs only when the wound rubs on
her bra. Patient denies any fevers or other signs of infection.

Physical examination demonstrates an approximate 2-3 cm wound at
site of prior biopsy which appears to be filled in with exuberant
granulation tissue that pouches out from the wound. No warmth or
purulent drainage is identified to suggest infection. This wound is
located near the inframammary fold. Given the presence of extremely
firm left breast implant resulting in the skin to be taut and the
location of this wound, healing could be prolonged/difficult.
The patient will be referred to a surgeon for further evaluation.
The patient agrees with this plan. The patient will be contacted
with an appointment for a surgical consultation.
CLINICAL DATA: Confirmation of clip placement after
ultrasound-guided core needle biopsy of a mass in the lower inner
quadrant of the left breast, performed earlier today.
EXAM:
DIAGNOSTIC LEFT MAMMOGRAM POST ULTRASOUND BIOPSY

[L MLO]
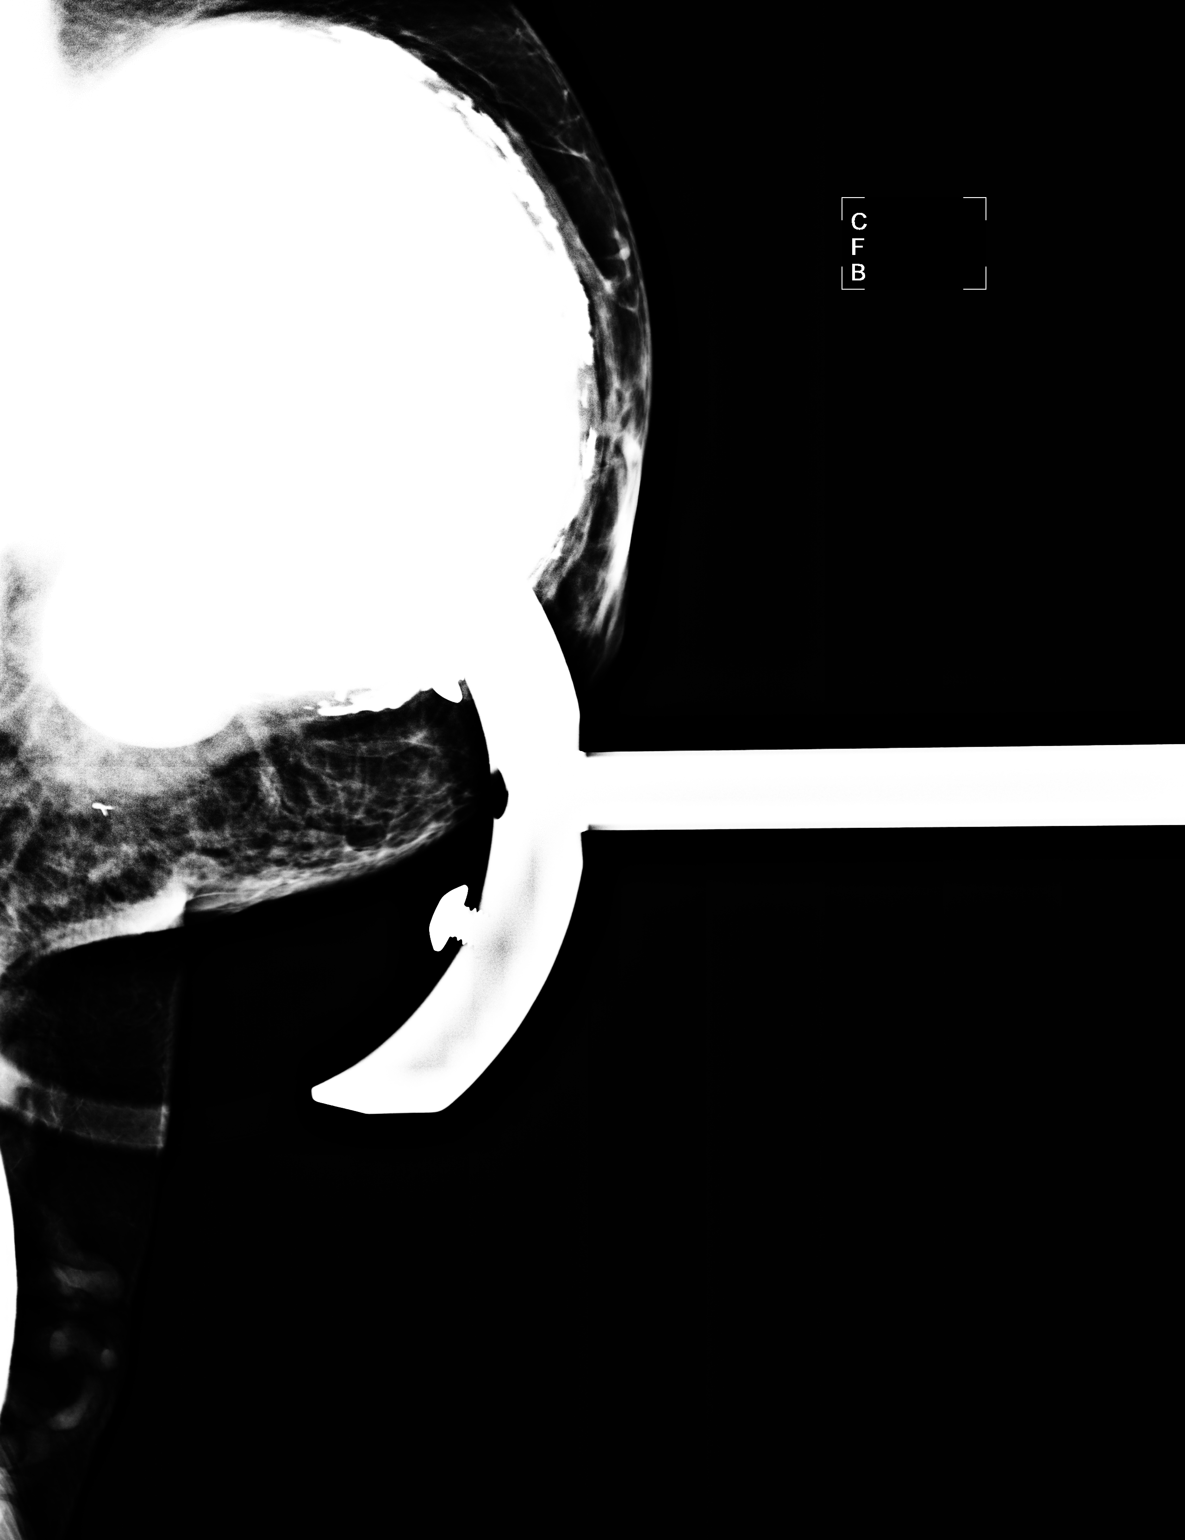

[1 of 1 positions shown; findings below may reference images not displayed]

FINDINGS: A standard 2D spot left MLO mammographic image was obtained
following ultrasound guided biopsy of a mass in the lower inner
quadrant of the left breast. The ribbon shaped tissue marker clip is
appropriately positioned at the periphery of the mass immediately
adjacent to the indwelling prepectoral silicone implant. Expected
post biopsy changes are present without evidence of hematoma.
IMPRESSION: Appropriate positioning of the ribbon shaped tissue marker clip at
the periphery of the mass in the lower inner quadrant of the left
breast.

Final Assessment: Post Procedure Mammograms for Marker Placement

## 2017-02-12 DIAGNOSIS — N390 Urinary tract infection, site not specified: Secondary | ICD-10-CM | POA: Diagnosis not present

## 2017-02-12 DIAGNOSIS — A499 Bacterial infection, unspecified: Secondary | ICD-10-CM | POA: Diagnosis not present

## 2017-02-12 DIAGNOSIS — K219 Gastro-esophageal reflux disease without esophagitis: Secondary | ICD-10-CM | POA: Diagnosis not present

## 2017-02-12 DIAGNOSIS — Z86718 Personal history of other venous thrombosis and embolism: Secondary | ICD-10-CM | POA: Diagnosis not present

## 2017-03-18 DIAGNOSIS — N390 Urinary tract infection, site not specified: Secondary | ICD-10-CM | POA: Diagnosis not present

## 2017-03-18 DIAGNOSIS — E876 Hypokalemia: Secondary | ICD-10-CM | POA: Diagnosis not present

## 2017-03-18 DIAGNOSIS — R109 Unspecified abdominal pain: Secondary | ICD-10-CM | POA: Diagnosis not present

## 2017-03-18 DIAGNOSIS — Z6830 Body mass index (BMI) 30.0-30.9, adult: Secondary | ICD-10-CM | POA: Diagnosis not present

## 2017-03-18 DIAGNOSIS — R8290 Unspecified abnormal findings in urine: Secondary | ICD-10-CM | POA: Diagnosis not present

## 2017-03-18 DIAGNOSIS — R1031 Right lower quadrant pain: Secondary | ICD-10-CM | POA: Diagnosis not present

## 2017-03-26 ENCOUNTER — Other Ambulatory Visit: Payer: Self-pay | Admitting: Internal Medicine

## 2017-03-26 DIAGNOSIS — R109 Unspecified abdominal pain: Secondary | ICD-10-CM

## 2017-03-29 ENCOUNTER — Ambulatory Visit
Admission: RE | Admit: 2017-03-29 | Discharge: 2017-03-29 | Disposition: A | Discharge status: Home / Self Care | Payer: Medicare Other | Source: Ambulatory Visit | Attending: Internal Medicine | Admitting: Internal Medicine

## 2017-03-29 DIAGNOSIS — N281 Cyst of kidney, acquired: Secondary | ICD-10-CM | POA: Diagnosis not present

## 2017-03-29 DIAGNOSIS — R109 Unspecified abdominal pain: Secondary | ICD-10-CM

## 2017-03-30 DIAGNOSIS — I868 Varicose veins of other specified sites: Secondary | ICD-10-CM | POA: Diagnosis not present

## 2017-03-30 DIAGNOSIS — K449 Diaphragmatic hernia without obstruction or gangrene: Secondary | ICD-10-CM | POA: Diagnosis not present

## 2017-03-30 DIAGNOSIS — R1031 Right lower quadrant pain: Secondary | ICD-10-CM | POA: Diagnosis not present

## 2017-03-30 DIAGNOSIS — R109 Unspecified abdominal pain: Secondary | ICD-10-CM | POA: Diagnosis not present

## 2017-03-30 DIAGNOSIS — Z6830 Body mass index (BMI) 30.0-30.9, adult: Secondary | ICD-10-CM | POA: Diagnosis not present

## 2017-04-05 DIAGNOSIS — M545 Low back pain: Secondary | ICD-10-CM | POA: Diagnosis not present

## 2017-04-09 DIAGNOSIS — R1084 Generalized abdominal pain: Secondary | ICD-10-CM | POA: Diagnosis not present

## 2017-04-12 DIAGNOSIS — H25013 Cortical age-related cataract, bilateral: Secondary | ICD-10-CM | POA: Diagnosis not present

## 2017-04-12 DIAGNOSIS — H2513 Age-related nuclear cataract, bilateral: Secondary | ICD-10-CM | POA: Diagnosis not present

## 2017-04-12 DIAGNOSIS — H52203 Unspecified astigmatism, bilateral: Secondary | ICD-10-CM | POA: Diagnosis not present

## 2017-04-12 DIAGNOSIS — H353131 Nonexudative age-related macular degeneration, bilateral, early dry stage: Secondary | ICD-10-CM | POA: Diagnosis not present

## 2017-04-29 DIAGNOSIS — M5416 Radiculopathy, lumbar region: Secondary | ICD-10-CM | POA: Insufficient documentation

## 2017-04-30 DIAGNOSIS — Z23 Encounter for immunization: Secondary | ICD-10-CM | POA: Diagnosis not present

## 2017-05-03 DIAGNOSIS — M5416 Radiculopathy, lumbar region: Secondary | ICD-10-CM | POA: Diagnosis not present

## 2017-05-10 DIAGNOSIS — M545 Low back pain: Secondary | ICD-10-CM | POA: Diagnosis not present

## 2017-05-10 DIAGNOSIS — M5416 Radiculopathy, lumbar region: Secondary | ICD-10-CM | POA: Diagnosis not present

## 2017-05-13 DIAGNOSIS — M5416 Radiculopathy, lumbar region: Secondary | ICD-10-CM | POA: Diagnosis not present

## 2017-05-13 DIAGNOSIS — M545 Low back pain: Secondary | ICD-10-CM | POA: Diagnosis not present

## 2017-05-17 DIAGNOSIS — M5416 Radiculopathy, lumbar region: Secondary | ICD-10-CM | POA: Diagnosis not present

## 2017-05-18 DIAGNOSIS — M545 Low back pain: Secondary | ICD-10-CM | POA: Diagnosis not present

## 2017-05-18 DIAGNOSIS — M5416 Radiculopathy, lumbar region: Secondary | ICD-10-CM | POA: Diagnosis not present

## 2017-05-18 DIAGNOSIS — M25561 Pain in right knee: Secondary | ICD-10-CM | POA: Insufficient documentation

## 2017-06-07 DIAGNOSIS — M25561 Pain in right knee: Secondary | ICD-10-CM | POA: Diagnosis not present

## 2017-07-06 DIAGNOSIS — L720 Epidermal cyst: Secondary | ICD-10-CM | POA: Diagnosis not present

## 2017-07-06 DIAGNOSIS — L738 Other specified follicular disorders: Secondary | ICD-10-CM | POA: Diagnosis not present

## 2017-07-07 DIAGNOSIS — Z23 Encounter for immunization: Secondary | ICD-10-CM | POA: Diagnosis not present

## 2017-07-21 DIAGNOSIS — Z7289 Other problems related to lifestyle: Secondary | ICD-10-CM | POA: Diagnosis not present

## 2017-07-21 DIAGNOSIS — H9201 Otalgia, right ear: Secondary | ICD-10-CM | POA: Diagnosis not present

## 2017-07-21 DIAGNOSIS — H6123 Impacted cerumen, bilateral: Secondary | ICD-10-CM | POA: Diagnosis not present

## 2017-07-21 DIAGNOSIS — Z87891 Personal history of nicotine dependence: Secondary | ICD-10-CM | POA: Diagnosis not present

## 2017-07-23 DIAGNOSIS — L821 Other seborrheic keratosis: Secondary | ICD-10-CM | POA: Diagnosis not present

## 2017-09-14 DIAGNOSIS — Z23 Encounter for immunization: Secondary | ICD-10-CM | POA: Diagnosis not present

## 2017-09-20 DIAGNOSIS — M859 Disorder of bone density and structure, unspecified: Secondary | ICD-10-CM | POA: Diagnosis not present

## 2017-09-20 DIAGNOSIS — E785 Hyperlipidemia, unspecified: Secondary | ICD-10-CM | POA: Diagnosis not present

## 2017-09-20 DIAGNOSIS — I10 Essential (primary) hypertension: Secondary | ICD-10-CM | POA: Diagnosis not present

## 2017-09-27 DIAGNOSIS — R82998 Other abnormal findings in urine: Secondary | ICD-10-CM | POA: Diagnosis not present

## 2017-09-27 DIAGNOSIS — I10 Essential (primary) hypertension: Secondary | ICD-10-CM | POA: Diagnosis not present

## 2017-09-28 DIAGNOSIS — M859 Disorder of bone density and structure, unspecified: Secondary | ICD-10-CM | POA: Diagnosis not present

## 2017-09-28 DIAGNOSIS — E7849 Other hyperlipidemia: Secondary | ICD-10-CM | POA: Diagnosis not present

## 2017-09-28 DIAGNOSIS — Z23 Encounter for immunization: Secondary | ICD-10-CM | POA: Diagnosis not present

## 2017-09-28 DIAGNOSIS — Z6829 Body mass index (BMI) 29.0-29.9, adult: Secondary | ICD-10-CM | POA: Diagnosis not present

## 2017-09-28 DIAGNOSIS — I868 Varicose veins of other specified sites: Secondary | ICD-10-CM | POA: Diagnosis not present

## 2017-09-28 DIAGNOSIS — Z0000 Encounter for general adult medical examination without abnormal findings: Secondary | ICD-10-CM | POA: Diagnosis not present

## 2017-09-28 DIAGNOSIS — E559 Vitamin D deficiency, unspecified: Secondary | ICD-10-CM | POA: Diagnosis not present

## 2017-09-28 DIAGNOSIS — I10 Essential (primary) hypertension: Secondary | ICD-10-CM | POA: Diagnosis not present

## 2017-09-28 DIAGNOSIS — E876 Hypokalemia: Secondary | ICD-10-CM | POA: Diagnosis not present

## 2017-09-28 DIAGNOSIS — Z1389 Encounter for screening for other disorder: Secondary | ICD-10-CM | POA: Diagnosis not present

## 2017-10-01 DIAGNOSIS — L03011 Cellulitis of right finger: Secondary | ICD-10-CM | POA: Diagnosis not present

## 2017-11-11 DIAGNOSIS — M859 Disorder of bone density and structure, unspecified: Secondary | ICD-10-CM | POA: Diagnosis not present

## 2017-12-27 DIAGNOSIS — Z23 Encounter for immunization: Secondary | ICD-10-CM | POA: Diagnosis not present

## 2018-01-08 DIAGNOSIS — N3090 Cystitis, unspecified without hematuria: Secondary | ICD-10-CM | POA: Diagnosis not present

## 2018-01-08 DIAGNOSIS — R300 Dysuria: Secondary | ICD-10-CM | POA: Diagnosis not present

## 2018-02-17 DIAGNOSIS — Z6830 Body mass index (BMI) 30.0-30.9, adult: Secondary | ICD-10-CM | POA: Diagnosis not present

## 2018-02-17 DIAGNOSIS — M859 Disorder of bone density and structure, unspecified: Secondary | ICD-10-CM | POA: Diagnosis not present

## 2018-02-17 DIAGNOSIS — M545 Low back pain: Secondary | ICD-10-CM | POA: Diagnosis not present

## 2018-02-17 DIAGNOSIS — E559 Vitamin D deficiency, unspecified: Secondary | ICD-10-CM | POA: Diagnosis not present

## 2018-02-17 DIAGNOSIS — E7849 Other hyperlipidemia: Secondary | ICD-10-CM | POA: Diagnosis not present

## 2018-03-01 DIAGNOSIS — M5416 Radiculopathy, lumbar region: Secondary | ICD-10-CM | POA: Diagnosis not present

## 2018-03-01 DIAGNOSIS — M545 Low back pain: Secondary | ICD-10-CM | POA: Diagnosis not present

## 2018-03-04 ENCOUNTER — Encounter (HOSPITAL_COMMUNITY): Payer: Self-pay

## 2018-04-13 DIAGNOSIS — N390 Urinary tract infection, site not specified: Secondary | ICD-10-CM | POA: Diagnosis not present

## 2018-04-13 DIAGNOSIS — R8290 Unspecified abnormal findings in urine: Secondary | ICD-10-CM | POA: Diagnosis not present

## 2018-06-01 DIAGNOSIS — R8290 Unspecified abnormal findings in urine: Secondary | ICD-10-CM | POA: Diagnosis not present

## 2018-06-01 DIAGNOSIS — N390 Urinary tract infection, site not specified: Secondary | ICD-10-CM | POA: Diagnosis not present

## 2018-10-06 DIAGNOSIS — E7849 Other hyperlipidemia: Secondary | ICD-10-CM | POA: Diagnosis not present

## 2018-10-06 DIAGNOSIS — M859 Disorder of bone density and structure, unspecified: Secondary | ICD-10-CM | POA: Diagnosis not present

## 2018-10-06 DIAGNOSIS — Z23 Encounter for immunization: Secondary | ICD-10-CM | POA: Diagnosis not present

## 2018-10-13 DIAGNOSIS — M8580 Other specified disorders of bone density and structure, unspecified site: Secondary | ICD-10-CM | POA: Diagnosis not present

## 2018-10-13 DIAGNOSIS — E876 Hypokalemia: Secondary | ICD-10-CM | POA: Diagnosis not present

## 2018-10-13 DIAGNOSIS — Z1339 Encounter for screening examination for other mental health and behavioral disorders: Secondary | ICD-10-CM | POA: Diagnosis not present

## 2018-10-13 DIAGNOSIS — E559 Vitamin D deficiency, unspecified: Secondary | ICD-10-CM | POA: Diagnosis not present

## 2018-10-13 DIAGNOSIS — I10 Essential (primary) hypertension: Secondary | ICD-10-CM | POA: Diagnosis not present

## 2018-10-13 DIAGNOSIS — M25561 Pain in right knee: Secondary | ICD-10-CM | POA: Diagnosis not present

## 2018-10-13 DIAGNOSIS — E785 Hyperlipidemia, unspecified: Secondary | ICD-10-CM | POA: Diagnosis not present

## 2018-10-13 DIAGNOSIS — Z1331 Encounter for screening for depression: Secondary | ICD-10-CM | POA: Diagnosis not present

## 2018-10-13 DIAGNOSIS — Z0000 Encounter for general adult medical examination without abnormal findings: Secondary | ICD-10-CM | POA: Diagnosis not present

## 2018-10-13 DIAGNOSIS — M545 Low back pain: Secondary | ICD-10-CM | POA: Diagnosis not present

## 2018-10-17 DIAGNOSIS — R82998 Other abnormal findings in urine: Secondary | ICD-10-CM | POA: Diagnosis not present

## 2018-10-21 DIAGNOSIS — M1712 Unilateral primary osteoarthritis, left knee: Secondary | ICD-10-CM | POA: Diagnosis not present

## 2018-10-21 DIAGNOSIS — M1711 Unilateral primary osteoarthritis, right knee: Secondary | ICD-10-CM | POA: Diagnosis not present

## 2018-10-21 DIAGNOSIS — M25562 Pain in left knee: Secondary | ICD-10-CM | POA: Diagnosis not present

## 2018-10-21 DIAGNOSIS — M25561 Pain in right knee: Secondary | ICD-10-CM | POA: Diagnosis not present

## 2018-10-26 DIAGNOSIS — Z1212 Encounter for screening for malignant neoplasm of rectum: Secondary | ICD-10-CM | POA: Diagnosis not present

## 2018-11-23 DIAGNOSIS — M170 Bilateral primary osteoarthritis of knee: Secondary | ICD-10-CM | POA: Diagnosis not present

## 2018-11-23 DIAGNOSIS — M25562 Pain in left knee: Secondary | ICD-10-CM | POA: Diagnosis not present

## 2018-11-23 DIAGNOSIS — M25561 Pain in right knee: Secondary | ICD-10-CM | POA: Diagnosis not present
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
## 2018-12-21 DIAGNOSIS — M170 Bilateral primary osteoarthritis of knee: Secondary | ICD-10-CM | POA: Diagnosis not present

## 2019-04-21 DIAGNOSIS — S161XXA Strain of muscle, fascia and tendon at neck level, initial encounter: Secondary | ICD-10-CM | POA: Insufficient documentation

## 2019-04-21 DIAGNOSIS — M25521 Pain in right elbow: Secondary | ICD-10-CM | POA: Diagnosis not present

## 2019-05-22 DIAGNOSIS — M542 Cervicalgia: Secondary | ICD-10-CM | POA: Diagnosis not present

## 2019-06-05 DIAGNOSIS — M542 Cervicalgia: Secondary | ICD-10-CM | POA: Insufficient documentation

## 2019-06-12 DIAGNOSIS — M542 Cervicalgia: Secondary | ICD-10-CM | POA: Diagnosis not present

## 2019-07-04 DIAGNOSIS — M542 Cervicalgia: Secondary | ICD-10-CM | POA: Diagnosis not present

## 2019-09-20 DIAGNOSIS — M545 Low back pain: Secondary | ICD-10-CM | POA: Diagnosis not present

## 2019-10-10 DIAGNOSIS — E785 Hyperlipidemia, unspecified: Secondary | ICD-10-CM | POA: Diagnosis not present

## 2019-10-10 DIAGNOSIS — M859 Disorder of bone density and structure, unspecified: Secondary | ICD-10-CM | POA: Diagnosis not present

## 2019-10-16 DIAGNOSIS — Z1331 Encounter for screening for depression: Secondary | ICD-10-CM | POA: Diagnosis not present

## 2019-10-16 DIAGNOSIS — I868 Varicose veins of other specified sites: Secondary | ICD-10-CM | POA: Diagnosis not present

## 2019-10-16 DIAGNOSIS — E785 Hyperlipidemia, unspecified: Secondary | ICD-10-CM | POA: Diagnosis not present

## 2019-10-16 DIAGNOSIS — M859 Disorder of bone density and structure, unspecified: Secondary | ICD-10-CM | POA: Diagnosis not present

## 2019-10-16 DIAGNOSIS — E559 Vitamin D deficiency, unspecified: Secondary | ICD-10-CM | POA: Diagnosis not present

## 2019-10-16 DIAGNOSIS — Z23 Encounter for immunization: Secondary | ICD-10-CM | POA: Diagnosis not present

## 2019-10-16 DIAGNOSIS — E876 Hypokalemia: Secondary | ICD-10-CM | POA: Diagnosis not present

## 2019-10-16 DIAGNOSIS — Z0000 Encounter for general adult medical examination without abnormal findings: Secondary | ICD-10-CM | POA: Diagnosis not present

## 2019-10-16 DIAGNOSIS — I10 Essential (primary) hypertension: Secondary | ICD-10-CM | POA: Diagnosis not present

## 2020-01-05 DIAGNOSIS — L308 Other specified dermatitis: Secondary | ICD-10-CM | POA: Diagnosis not present

## 2020-01-05 DIAGNOSIS — L2089 Other atopic dermatitis: Secondary | ICD-10-CM | POA: Diagnosis not present

## 2020-03-13 DIAGNOSIS — M25561 Pain in right knee: Secondary | ICD-10-CM | POA: Diagnosis not present

## 2020-03-13 DIAGNOSIS — M79604 Pain in right leg: Secondary | ICD-10-CM | POA: Diagnosis not present

## 2020-04-12 DIAGNOSIS — N390 Urinary tract infection, site not specified: Secondary | ICD-10-CM | POA: Diagnosis not present

## 2020-04-12 DIAGNOSIS — R300 Dysuria: Secondary | ICD-10-CM | POA: Diagnosis not present

## 2020-05-03 DIAGNOSIS — H6123 Impacted cerumen, bilateral: Secondary | ICD-10-CM | POA: Diagnosis not present

## 2020-12-30 DIAGNOSIS — E559 Vitamin D deficiency, unspecified: Secondary | ICD-10-CM | POA: Diagnosis not present

## 2020-12-30 DIAGNOSIS — E785 Hyperlipidemia, unspecified: Secondary | ICD-10-CM | POA: Diagnosis not present

## 2020-12-30 DIAGNOSIS — I10 Essential (primary) hypertension: Secondary | ICD-10-CM | POA: Diagnosis not present

## 2020-12-30 DIAGNOSIS — M810 Age-related osteoporosis without current pathological fracture: Secondary | ICD-10-CM | POA: Diagnosis not present

## 2021-01-01 DIAGNOSIS — M25561 Pain in right knee: Secondary | ICD-10-CM | POA: Diagnosis not present

## 2021-01-01 DIAGNOSIS — I868 Varicose veins of other specified sites: Secondary | ICD-10-CM | POA: Diagnosis not present

## 2021-01-01 DIAGNOSIS — I700 Atherosclerosis of aorta: Secondary | ICD-10-CM | POA: Diagnosis not present

## 2021-01-01 DIAGNOSIS — R011 Cardiac murmur, unspecified: Secondary | ICD-10-CM | POA: Diagnosis not present

## 2021-01-01 DIAGNOSIS — K219 Gastro-esophageal reflux disease without esophagitis: Secondary | ICD-10-CM | POA: Diagnosis not present

## 2021-01-01 DIAGNOSIS — Z1339 Encounter for screening examination for other mental health and behavioral disorders: Secondary | ICD-10-CM | POA: Diagnosis not present

## 2021-01-01 DIAGNOSIS — Z0000 Encounter for general adult medical examination without abnormal findings: Secondary | ICD-10-CM | POA: Diagnosis not present

## 2021-01-01 DIAGNOSIS — M8580 Other specified disorders of bone density and structure, unspecified site: Secondary | ICD-10-CM | POA: Diagnosis not present

## 2021-01-01 DIAGNOSIS — M5416 Radiculopathy, lumbar region: Secondary | ICD-10-CM | POA: Diagnosis not present

## 2021-01-01 DIAGNOSIS — E785 Hyperlipidemia, unspecified: Secondary | ICD-10-CM | POA: Diagnosis not present

## 2021-01-01 DIAGNOSIS — E559 Vitamin D deficiency, unspecified: Secondary | ICD-10-CM | POA: Diagnosis not present

## 2021-01-01 DIAGNOSIS — I10 Essential (primary) hypertension: Secondary | ICD-10-CM | POA: Diagnosis not present

## 2021-01-01 DIAGNOSIS — E876 Hypokalemia: Secondary | ICD-10-CM | POA: Diagnosis not present

## 2021-01-01 DIAGNOSIS — Z1331 Encounter for screening for depression: Secondary | ICD-10-CM | POA: Diagnosis not present

## 2021-01-02 ENCOUNTER — Other Ambulatory Visit (HOSPITAL_COMMUNITY): Payer: Self-pay | Admitting: Internal Medicine

## 2021-01-02 DIAGNOSIS — R011 Cardiac murmur, unspecified: Secondary | ICD-10-CM

## 2021-01-03 ENCOUNTER — Other Ambulatory Visit: Payer: Self-pay | Admitting: Internal Medicine

## 2021-01-03 DIAGNOSIS — Z23 Encounter for immunization: Secondary | ICD-10-CM | POA: Diagnosis not present

## 2021-01-03 DIAGNOSIS — M5416 Radiculopathy, lumbar region: Secondary | ICD-10-CM

## 2021-01-21 ENCOUNTER — Other Ambulatory Visit: Payer: Self-pay

## 2021-01-21 ENCOUNTER — Ambulatory Visit (HOSPITAL_COMMUNITY): Payer: Medicare Other | Attending: Cardiology

## 2021-01-21 DIAGNOSIS — R011 Cardiac murmur, unspecified: Secondary | ICD-10-CM | POA: Insufficient documentation

## 2021-01-21 LAB — ECHOCARDIOGRAM COMPLETE
Area-P 1/2: 3.21 cm2
S' Lateral: 2.4 cm

## 2021-02-07 DIAGNOSIS — H2513 Age-related nuclear cataract, bilateral: Secondary | ICD-10-CM | POA: Diagnosis not present

## 2021-02-07 DIAGNOSIS — H353131 Nonexudative age-related macular degeneration, bilateral, early dry stage: Secondary | ICD-10-CM | POA: Diagnosis not present

## 2021-02-07 DIAGNOSIS — H5203 Hypermetropia, bilateral: Secondary | ICD-10-CM | POA: Diagnosis not present

## 2021-02-10 ENCOUNTER — Other Ambulatory Visit: Payer: Self-pay

## 2021-02-10 ENCOUNTER — Ambulatory Visit
Admission: RE | Admit: 2021-02-10 | Discharge: 2021-02-10 | Disposition: A | Discharge status: Home / Self Care | Payer: Medicare Other | Source: Ambulatory Visit | Attending: Internal Medicine | Admitting: Internal Medicine

## 2021-02-10 DIAGNOSIS — M5136 Other intervertebral disc degeneration, lumbar region: Secondary | ICD-10-CM | POA: Diagnosis not present

## 2021-02-10 DIAGNOSIS — M5416 Radiculopathy, lumbar region: Secondary | ICD-10-CM

## 2021-02-10 DIAGNOSIS — M48061 Spinal stenosis, lumbar region without neurogenic claudication: Secondary | ICD-10-CM | POA: Diagnosis not present

## 2021-02-21 DIAGNOSIS — H2513 Age-related nuclear cataract, bilateral: Secondary | ICD-10-CM | POA: Diagnosis not present

## 2021-03-06 DIAGNOSIS — H25812 Combined forms of age-related cataract, left eye: Secondary | ICD-10-CM | POA: Diagnosis not present

## 2021-03-06 DIAGNOSIS — H2512 Age-related nuclear cataract, left eye: Secondary | ICD-10-CM | POA: Diagnosis not present

## 2021-03-27 DIAGNOSIS — H25811 Combined forms of age-related cataract, right eye: Secondary | ICD-10-CM | POA: Diagnosis not present

## 2021-03-27 DIAGNOSIS — H2511 Age-related nuclear cataract, right eye: Secondary | ICD-10-CM | POA: Diagnosis not present

## 2021-04-23 DIAGNOSIS — H6123 Impacted cerumen, bilateral: Secondary | ICD-10-CM | POA: Diagnosis not present

## 2021-05-26 DIAGNOSIS — S0001XA Abrasion of scalp, initial encounter: Secondary | ICD-10-CM | POA: Diagnosis not present

## 2021-05-26 DIAGNOSIS — W010XXA Fall on same level from slipping, tripping and stumbling without subsequent striking against object, initial encounter: Secondary | ICD-10-CM | POA: Diagnosis not present

## 2021-05-26 DIAGNOSIS — M48061 Spinal stenosis, lumbar region without neurogenic claudication: Secondary | ICD-10-CM | POA: Diagnosis not present

## 2021-05-26 DIAGNOSIS — Z6825 Body mass index (BMI) 25.0-25.9, adult: Secondary | ICD-10-CM | POA: Diagnosis not present

## 2021-05-30 DIAGNOSIS — S0592XA Unspecified injury of left eye and orbit, initial encounter: Secondary | ICD-10-CM | POA: Diagnosis not present

## 2021-06-02 DIAGNOSIS — J020 Streptococcal pharyngitis: Secondary | ICD-10-CM | POA: Diagnosis not present

## 2021-06-02 DIAGNOSIS — R234 Changes in skin texture: Secondary | ICD-10-CM | POA: Diagnosis not present

## 2021-06-05 DIAGNOSIS — R051 Acute cough: Secondary | ICD-10-CM | POA: Diagnosis not present

## 2021-06-16 ENCOUNTER — Ambulatory Visit: Payer: Medicare Other | Attending: Neurological Surgery

## 2021-06-16 DIAGNOSIS — M5459 Other low back pain: Secondary | ICD-10-CM | POA: Diagnosis not present

## 2021-06-16 DIAGNOSIS — M6281 Muscle weakness (generalized): Secondary | ICD-10-CM | POA: Diagnosis not present

## 2021-06-16 NOTE — Therapy (Signed)
?OUTPATIENT PHYSICAL THERAPY THORACOLUMBAR EVALUATION ? ? ?Patient Name: Kathryn Mann ?MRN: [id number] ?DOB:09/23/1935, 86 y.o., female ?Today's Date: 06/17/2021 ? ? PT End of Session - 06/16/21 1435   ? ? Visit Number 1   ? Number of Visits 17   ? Date for PT Re-Evaluation 08/11/21   ? Authorization Type Medicare   ? PT Start Time 5573   ? PT Stop Time 1525   ? PT Time Calculation (min) 40 min   ? Activity Tolerance Patient tolerated treatment well   ? Behavior During Therapy [facility] for tasks assessed/performed   ? ?  ?  ? ?  ? ? ?Past Medical History:  ?Diagnosis Date  ? GERD (gastroesophageal reflux disease)   ? Hypertension   ? ?Past Surgical History:  ?Procedure Laterality Date  ? ABDOMINAL HYSTERECTOMY    ? FOOT SURGERY    ? right foot surgery  ? ?There are no problems to display for this patient. ? ? ?PCP: Ginger Organ., MD ? ?REFERRING PROVIDER: Dawley, Theodoro Doing, DO ? ?REFERRING DIAG:  ?M48.061 (ICD-10-CM) - Spinal stenosis, lumbar region without neurogenic claudication ? ?THERAPY DIAG:  ?Other low back pain - Plan: PT plan of care cert/re-cert ? ?Muscle weakness (generalized) - Plan: PT plan of care cert/re-cert ? ?ONSET DATE: Chronic ? ?SUBJECTIVE:                                                                                                                                                                                          ? ?SUBJECTIVE STATEMENT: ?Pt presents to PT with reports of lower back pain with referral into R LE. Notes that the "sciatic" type pain has been around since last fall where she was doing a lot of traveling. Some paresthesias associated with the pain, mainly "tingling" in R distal calf. Pt denies bowel/bladder changes or saddle anesthesia. Was doing pilates prior to Covid and recent traveling and believes this really helped her pain and strength.  ? ?PERTINENT HISTORY:  ?HTN ? ?PAIN:  ?Are you having pain?  ?No: NPRS scale: 0/10 (7/10 at worst) ?Pain location: lower back; R LE   ?Pain description: sharp, tingling ?Aggravating factors: prolonged sitting,  ?Relieving factors: medication ? ? ?PRECAUTIONS: None ? ?WEIGHT BEARING RESTRICTIONS No ? ?FALLS:  ?Has patient fallen in last 6 months? Yes. Number of falls - one; fell due to getting her heel caught ? ?LIVING ENVIRONMENT: ?Lives with: lives with their family ?Lives in: House/apartment ?Stairs: Yes: Internal: 14 steps; on left going up ?Has following equipment at home: Single point cane ? ?OCCUPATION: Retired ? ?PLOF: Independent and Independent with basic ADLs ? ?  PATIENT GOALS: decrease lower back and R LE pain, increase R LE strength  ? ? ?OBJECTIVE:  ? ?DIAGNOSTIC FINDINGS:  ?CLINICAL DATA:  Lumbar radiculopathy. ?  ?EXAM: ?MRI LUMBAR SPINE WITHOUT CONTRAST ?  ?TECHNIQUE: ?Multiplanar, multisequence MR imaging of the lumbar spine was ?performed. No intravenous contrast was administered. ?  ?COMPARISON:  None. ?  ?FINDINGS: ?Segmentation:  Standard. ?  ?Alignment: Grade 1 anterolisthesis of L4 over L5. Levoconvex ?scoliosis. ?  ?Vertebrae: No fracture, evidence of discitis, or bone lesion. ?Endplate degenerative changes at L4-5. ?  ?Conus medullaris and cauda equina: Conus extends to the T12-L1 ?level. Conus and cauda equina appear normal. ?  ?Paraspinal and other soft tissues: Bilateral renal cyst. Ectatic ?vessels on the right side of the pelvis. ?  ?Disc levels: ?  ?T12-L1: Small right foraminal/far lateral disc protrusion and mild ?facet degenerative changes resulting in mild right neural foraminal ?narrowing. No significant spinal canal stenosis. ?  ?L1-2 shallow disc bulge and mild facet degenerative changes no ?significant spinal canal or neural foraminal stenosis. ?  ?L2-3: Disc bulge and moderate facet degenerative changes resulting ?in mild spinal canal stenosis and mild bilateral neural foraminal ?narrowing. ?  ?L3-4: Shallow disc bulge and hypertrophic facet degenerative changes ?resulting in mild spinal canal stenosis and  mild bilateral neural ?foraminal narrowing. ?  ?L4-5: Disc bulge/disc uncovering, prominent hypertrophic facet ?degenerative changes ligamentum flavum redundancy resulting in ?severe spinal canal stenosis and moderate left neural foraminal ?narrowing. ?  ?L5-S1: Left asymmetric disc bulge and prominent hypertrophic facet ?degenerative changes resulting in mild spinal canal stenosis with ?narrowing of the left subarticular zone and mild left neural ?foraminal narrowing. ?  ?IMPRESSION: ?Advanced degenerative changes of the lumbar spine resulting severe ?spinal canal stenosis and moderate left neural foraminal narrowing ?at L4-5.  ? ? ?PATIENT SURVEYS:  ?FOTO - will assess next session ? ?COGNITION: ? Overall cognitive status: Within functional limits for tasks assessed   ?  ?SENSATION: ?WFL ? ?POSTURE:  ?Small body habitus, increased thoracic kyphosis, fwd head ? ?PALPATION: ?TTP to R lumbar paraspinals ? ?LE MMT: ? ?MMT Right ?06/16/2021 Left ?06/16/2021  ?Hip flexion  3+/5 4/5  ?Hip extension    ?Hip abduction 3+/5 4/5  ?Hip adduction 3+/5 4/5  ?Hip external rotation    ?Hip internal rotation    ?Knee extension 4/5 4/5  ?Knee flexion 3+/5 4/5  ?Ankle dorsiflexion     ?Ankle plantarflexion    ?Ankle inversion    ?Ankle eversion    ?Grossly    ?(Blank rows = not tested) ? ?LUMBAR SPECIAL TESTS:  ?Straight leg raise test: Negative and Slump test: Positive ? ?FUNCTIONAL TESTS:  ?30 Second Sit to Stand: 12 reps ? ?GAIT: ?Distance walked: 23f ?Assistive device utilized: None ?Level of assistance: Complete Independence ?Comments: decreased trunk extension, slowed gait speed ? ? ? ?TODAY'S TREATMENT  [facility]                                                [facility] ?Therapeutic Exercise: ?Seated sciatic nerve glide R x 10 ?Supine sciatic nerve glide R x 10 ?Supine LTR x 10 ?Supine PPT x 5 - 5" hold ?Supine piriformis stretch x 30" R ? ?PATIENT EDUCATION:  ?Education details: eval findings, HEP,  POC ?Person educated: Patient ?Education method: Explanation, Demonstration, and Handouts ?Education comprehension: verbalized understanding and  returned demonstration ? ? ?HOME EXERCISE PROGRAM: ?Access Code: [email or web address] ?URL: [email or web address] ?Date: 06/16/2021 ?Prepared by: [name] ? ?Exercises ?- Seated Sciatic Tensioner  - 1 x daily - 7 x weekly - 2 sets - 10 reps ?- Supine Sciatic Nerve Glide  - 1 x daily - 7 x weekly - 2 sets - 10 reps ?- Supine Lower Trunk Rotation  - 1 x daily - 7 x weekly - 2 sets - 10 reps ?- Supine Posterior Pelvic Tilt  - 1 x daily - 7 x weekly - 2 sets - 10 reps - 5 sec hold ?- Supine Piriformis Stretch with Leg Straight  - 1 x daily - 7 x weekly - 2 reps - 30 sec hold ? ?ASSESSMENT: ? ?CLINICAL IMPRESSION: ?Patient is a 86 y.o. F who was seen today for physical therapy evaluation and treatment for chronic LBP with R sided sciatica. Physical findings are consistent with referring physician impression, as pt demonstrates proximal hip and core weakness along with positive neural tension findings and palpable LBP. Her FOTO score indicates decreased functional ability in the performance of home ADLs and shows she is operating below PLOF. Pt would benefit from skilled PT services working on improving core and proximal hip strength and decreasing neural tension for improving comfort and function.   ? ? ?OBJECTIVE IMPAIRMENTS decreased balance, decreased endurance, decreased mobility, difficulty walking, decreased ROM, decreased strength, improper body mechanics, postural dysfunction, and pain.  ? ?ACTIVITY LIMITATIONS cleaning, community activity, laundry, and yard work.  ? ?PERSONAL FACTORS Time since onset of injury/illness/exacerbation and 1 comorbidity: HTN  are also affecting patient's functional outcome.  ? ? ?REHAB POTENTIAL: Excellent ? ?CLINICAL DECISION MAKING: Stable/uncomplicated ? ?EVALUATION COMPLEXITY: Low ? ? ?GOALS: ?Goals reviewed with patient?  No ? ?SHORT TERM GOALS: Target date: 07/08/2021   ? ?Pt will be compliant and knowledgeable with initial HEP for improved comfort and carryover ?Baseline: initial HEP given  ?Goal status: INITIAL ? ?2.  Pt will self report

## 2021-06-19 ENCOUNTER — Ambulatory Visit: Payer: Medicare Other

## 2021-06-19 NOTE — Therapy (Incomplete)
  OUTPATIENT PHYSICAL THERAPY TREATMENT NOTE   Patient Name: REMMINGTON TETERS MRN: [id number] DOB:January 21, 1936, 86 y.o., female Today's Date: 06/19/2021  PCP: Ginger Organ., MD REFERRING PROVIDER: Dawley, Theodoro Doing, DO  END OF SESSION:    Past Medical History:  Diagnosis Date   GERD (gastroesophageal reflux disease)    Hypertension    Past Surgical History:  Procedure Laterality Date   ABDOMINAL HYSTERECTOMY     FOOT SURGERY     right foot surgery   There are no problems to display for this patient.   REFERRING DIAG: M48.061 (ICD-10-CM) - Spinal stenosis, lumbar region without neurogenic claudication  THERAPY DIAG:  No diagnosis found.  PERTINENT HISTORY:  HTN  PRECAUTIONS: None  SUBJECTIVE:  ***  PAIN:  Are you having pain?  No: NPRS scale: 0/10 (7/10 at worst) Pain location: lower back; R LE  Pain description: sharp, tingling Aggravating factors: prolonged sitting,  Relieving factors: medication   OBJECTIVE: (objective measures completed at initial evaluation unless otherwise dated)   (Copy Eval's Objective through Plan section here)   Ward Chatters, PT 06/19/2021, 9:08 AM

## 2021-06-23 ENCOUNTER — Ambulatory Visit: Payer: Medicare Other

## 2021-06-23 DIAGNOSIS — M5459 Other low back pain: Secondary | ICD-10-CM | POA: Diagnosis not present

## 2021-06-23 DIAGNOSIS — M6281 Muscle weakness (generalized): Secondary | ICD-10-CM | POA: Diagnosis not present

## 2021-06-23 NOTE — Therapy (Signed)
OUTPATIENT PHYSICAL THERAPY TREATMENT NOTE   Patient Name: Kathryn Mann MRN: [id number] DOB:10/23/1935, 86 y.o., female Today's Date: 06/23/2021  PCP: Ginger Organ., MD REFERRING PROVIDER: Dawley, Theodoro Doing, DO  END OF SESSION:   PT End of Session - 06/23/21 1335     Visit Number 2    Number of Visits 17    Date for PT Re-Evaluation 08/11/21    Authorization Type Medicare    PT Start Time 1335    PT Stop Time 7341    PT Time Calculation (min) 40 min    Activity Tolerance Patient tolerated treatment well    Behavior During Therapy WFL for tasks assessed/performed             Past Medical History:  Diagnosis Date   GERD (gastroesophageal reflux disease)    Hypertension    Past Surgical History:  Procedure Laterality Date   ABDOMINAL HYSTERECTOMY     FOOT SURGERY     right foot surgery   There are no problems to display for this patient.   REFERRING DIAG: M48.061 (ICD-10-CM) - Spinal stenosis, lumbar region without neurogenic claudication  THERAPY DIAG:  Other low back pain  Muscle weakness (generalized)  Rationale for Evaluation and Treatment Rehabilitation  PERTINENT HISTORY: HTN  PRECAUTIONS: None  SUBJECTIVE: Patient reports that she hasn't been doing her HEP due to pain that she says was because of the weather and she also had a stomach virus. She reports her pain is higher today due to a weather system that is coming through.  PAIN:  Are you having pain?  No: NPRS scale: 7/10 (7/10 at worst) Pain location: lower back; R LE  Pain description: sharp, tingling Aggravating factors: prolonged sitting,  Relieving factors: medication   OBJECTIVE: (objective measures completed at initial evaluation unless otherwise dated)   DIAGNOSTIC FINDINGS:  CLINICAL DATA:  Lumbar radiculopathy.   EXAM: MRI LUMBAR SPINE WITHOUT CONTRAST   TECHNIQUE: Multiplanar, multisequence MR imaging of the lumbar spine was performed. No intravenous contrast was  administered.   COMPARISON:  None.   FINDINGS: Segmentation:  Standard.   Alignment: Grade 1 anterolisthesis of L4 over L5. Levoconvex scoliosis.   Vertebrae: No fracture, evidence of discitis, or bone lesion. Endplate degenerative changes at L4-5.   Conus medullaris and cauda equina: Conus extends to the T12-L1 level. Conus and cauda equina appear normal.   Paraspinal and other soft tissues: Bilateral renal cyst. Ectatic vessels on the right side of the pelvis.   Disc levels:   T12-L1: Small right foraminal/far lateral disc protrusion and mild facet degenerative changes resulting in mild right neural foraminal narrowing. No significant spinal canal stenosis.   L1-2 shallow disc bulge and mild facet degenerative changes no significant spinal canal or neural foraminal stenosis.   L2-3: Disc bulge and moderate facet degenerative changes resulting in mild spinal canal stenosis and mild bilateral neural foraminal narrowing.   L3-4: Shallow disc bulge and hypertrophic facet degenerative changes resulting in mild spinal canal stenosis and mild bilateral neural foraminal narrowing.   L4-5: Disc bulge/disc uncovering, prominent hypertrophic facet degenerative changes ligamentum flavum redundancy resulting in severe spinal canal stenosis and moderate left neural foraminal narrowing.   L5-S1: Left asymmetric disc bulge and prominent hypertrophic facet degenerative changes resulting in mild spinal canal stenosis with narrowing of the left subarticular zone and mild left neural foraminal narrowing.   IMPRESSION: Advanced degenerative changes of the lumbar spine resulting severe spinal canal stenosis and moderate left neural foraminal  narrowing at L4-5.      PATIENT SURVEYS:  FOTO - will assess next session   COGNITION:           Overall cognitive status: Within functional limits for tasks assessed                          SENSATION: WFL   POSTURE:  Small body  habitus, increased thoracic kyphosis, fwd head   PALPATION: TTP to R lumbar paraspinals   LE MMT:   MMT Right 06/16/2021 Left 06/16/2021  Hip flexion  3+/5 4/5  Hip extension      Hip abduction 3+/5 4/5  Hip adduction 3+/5 4/5  Hip external rotation      Hip internal rotation      Knee extension 4/5 4/5  Knee flexion 3+/5 4/5  Ankle dorsiflexion       Ankle plantarflexion      Ankle inversion      Ankle eversion      Grossly      (Blank rows = not tested)   LUMBAR SPECIAL TESTS:  Straight leg raise test: Negative and Slump test: Positive   FUNCTIONAL TESTS:  30 Second Sit to Stand: 12 reps   GAIT: Distance walked: 60f Assistive device utilized: None Level of assistance: Complete Independence Comments: decreased trunk extension, slowed gait speed       TODAY'S TREATMENT  [facility] Adult PT Treatment:                                                DATE: 06/23/2021 Therapeutic Exercise: Supine sciatic nerve glide x20 Supine clamshells GTB 2x10 Supine march GTB 2x10 BIL Supine PPT 5" hold 2x10 Supine figure 4 piriformis stretch 2x30" Supine hamstring stretch with strap 2x30" BIL SLR x10 BIL LTR x10 BIL Seated hamstring curl GTB 2x10 BIL LAQ 2x10 BIL   [facility] Adult PT Treatment:                                                DATE: 06/16/2021 Therapeutic Exercise: Seated sciatic nerve glide R x 10 Supine sciatic nerve glide R x 10 Supine LTR x 10 Supine PPT x 5 - 5" hold Supine piriformis stretch x 30" R   PATIENT EDUCATION:  Education details: eval findings, HEP, POC Person educated: Patient Education method: Explanation, Demonstration, and Handouts Education comprehension: verbalized understanding and returned demonstration     HOME EXERCISE PROGRAM: Access Code: [email or web address] URL: [email or web address] Date: 06/16/2021 Prepared by: DOctavio Manns  Exercises - Seated Sciatic Tensioner  - 1 x daily - 7 x weekly - 2 sets - 10 reps - Supine Sciatic  Nerve Glide  - 1 x daily - 7 x weekly - 2 sets - 10 reps - Supine Lower Trunk Rotation  - 1 x daily - 7 x weekly - 2 sets - 10 reps - Supine Posterior Pelvic Tilt  - 1 x daily - 7 x weekly - 2 sets - 10 reps - 5 sec hold - Supine Piriformis Stretch with Leg Straight  - 1 x daily - 7 x weekly - 2 reps - 30 sec hold   ASSESSMENT:   CLINICAL IMPRESSION: Patient presents  to PT with high levels of pain in her lower back and pain in her RLE related to her R sided sciatica. She reports that she often has pain in her legs with weather systems. Session today focused on proximal hip and core strengthening as well as sciatic nerve glides to decrease radiating symptoms. Patient was able to tolerate all prescribed exercises with no adverse effects. Patient continues to benefit from skilled PT services and should be progressed as able to improve functional independence.      OBJECTIVE IMPAIRMENTS decreased balance, decreased endurance, decreased mobility, difficulty walking, decreased ROM, decreased strength, improper body mechanics, postural dysfunction, and pain.    ACTIVITY LIMITATIONS cleaning, community activity, laundry, and yard work.    PERSONAL FACTORS Time since onset of injury/illness/exacerbation and 1 comorbidity: HTN  are also affecting patient's functional outcome.      REHAB POTENTIAL: Excellent   CLINICAL DECISION MAKING: Stable/uncomplicated   EVALUATION COMPLEXITY: Low     GOALS: Goals reviewed with patient? No   SHORT TERM GOALS: Target date: 07/08/2021     Pt will be compliant and knowledgeable with initial HEP for improved comfort and carryover Baseline: initial HEP given  Goal status: INITIAL   2.  Pt will self report lower back and right LE pain no greater than 6/10 for improved comfort and functional ability Baseline: 7/10 at worst Goal status: INITIAL   LONG TERM GOALS: Target date: 08/11/2021     Pt will improve FOTO function score to no less than predicted value as  proxy for functional improvement Baseline: will assess next session Goal status: INITIAL   2.  Pt will self report lower back and right LE pain no greater than 2/10 for improved comfort and functional ability Baseline: 7/10 at worst Goal status: INITIAL   3.  Pt will improve R LE MMT to no less than 4+/5 for all tested motions for improved functional mobility Baseline: see chart Goal status: INITIAL   4.  Pt will increase 30 Second Sit to Stand rep count to no less than 14 reps for improved balance, strength, and functional mobility Baseline: 12 reps  Goal status: INITIAL   PLAN: PT FREQUENCY: 2x/week   PT DURATION: 8 weeks   PLANNED INTERVENTIONS: Therapeutic exercises, Therapeutic activity, Neuromuscular re-education, Balance training, Gait training, Patient/Family education, Joint mobilization, Aquatic Therapy, Dry Needling, Electrical stimulation, Cryotherapy, Moist heat, Manual therapy, and Re-evaluation.   PLAN FOR NEXT SESSION: assess HEP response, pilates-based exercise, progress neutral spine strengthening, Nustep next session    Evelene Croon, PTA 06/23/2021, 1:36 PM

## 2021-06-26 ENCOUNTER — Ambulatory Visit: Payer: Medicare Other

## 2021-06-26 DIAGNOSIS — M5459 Other low back pain: Secondary | ICD-10-CM | POA: Diagnosis not present

## 2021-06-26 DIAGNOSIS — M6281 Muscle weakness (generalized): Secondary | ICD-10-CM

## 2021-06-26 NOTE — Therapy (Signed)
OUTPATIENT PHYSICAL THERAPY TREATMENT NOTE   Patient Name: Kathryn Mann MRN: [id number] DOB:[birth year], 86 y.o., female Today's Date: 06/26/2021  PCP: Ginger Organ., MD REFERRING PROVIDER: Dawley, Theodoro Doing, DO  END OF SESSION:   PT End of Session - 06/26/21 1823     Visit Number 3    Number of Visits 17    Date for PT Re-Evaluation 08/11/21    Authorization Type Medicare    PT Start Time 2952    PT Stop Time 1905    PT Time Calculation (min) 40 min    Activity Tolerance Patient tolerated treatment well    Behavior During Therapy WFL for tasks assessed/performed              Past Medical History:  Diagnosis Date   GERD (gastroesophageal reflux disease)    Hypertension    Past Surgical History:  Procedure Laterality Date   ABDOMINAL HYSTERECTOMY     FOOT SURGERY     right foot surgery   There are no problems to display for this patient.   REFERRING DIAG: M48.061 (ICD-10-CM) - Spinal stenosis, lumbar region without neurogenic claudication  THERAPY DIAG:  Other low back pain  Muscle weakness (generalized)  Rationale for Evaluation and Treatment Rehabilitation  PERTINENT HISTORY: HTN  PRECAUTIONS: None  SUBJECTIVE:  Pt presents to PT with reports of continued lower back pain and discomfort. She has compliant with HEP with no adverse effect. Pt is ready to begin PT at this time.  PAIN:  Are you having pain?  No: NPRS scale: 5/10 (7/10 at worst) Pain location: lower back; R LE  Pain description: sharp, tingling Aggravating factors: prolonged sitting,  Relieving factors: medication   OBJECTIVE: (objective measures completed at initial evaluation unless otherwise dated)  PATIENT SURVEYS:  FOTO - will assess next session   COGNITION:           Overall cognitive status: Within functional limits for tasks assessed                          SENSATION: WFL   POSTURE:  Small body habitus, increased thoracic kyphosis, fwd head   PALPATION: TTP to  R lumbar paraspinals   LE MMT:   MMT Right 06/16/2021 Left 06/16/2021  Hip flexion  3+/5 4/5  Hip extension      Hip abduction 3+/5 4/5  Hip adduction 3+/5 4/5  Hip external rotation      Hip internal rotation      Knee extension 4/5 4/5  Knee flexion 3+/5 4/5  Ankle dorsiflexion       Ankle plantarflexion      Ankle inversion      Ankle eversion      Grossly      (Blank rows = not tested)   LUMBAR SPECIAL TESTS:  Straight leg raise test: Negative and Slump test: Positive   FUNCTIONAL TESTS:  30 Second Sit to Stand: 12 reps   GAIT: Distance walked: 30f Assistive device utilized: None Level of assistance: Complete Independence Comments: decreased trunk extension, slowed gait speed       TODAY'S TREATMENT  [facility] Adult PT Treatment:                                                DATE: 06/26/2021 Therapeutic Exercise: NuStep lvl  5 UE/LE x 4 min while taking subjective  Supine sciatic nerve glide x 20 Supine PPT x 10 - 5" hold  Supine PPT with ball squeeze x 10 - 5" hold  Supine PPT with march x 10  Supine 90/90 3x10" Supine hundreds 2x20 Supine SLR (small range) 2x10 each Supine clamshells GTB 2x20 Standing hip abd/ext 2x10 YTB Pilates springboard pulldown 2x10 Yellow Pilates springboard row 2x10 Yellow Leg press 2x10 35#  [facility] Adult PT Treatment:                                                DATE: 06/23/2021 Therapeutic Exercise: Supine sciatic nerve glide x20 Supine clamshells GTB 2x10 Supine march GTB 2x10 BIL Supine PPT 5" hold 2x10 Supine figure 4 piriformis stretch 2x30" Supine hamstring stretch with strap 2x30" BIL SLR x10 BIL LTR x10 BIL Seated hamstring curl GTB 2x10 BIL LAQ 2x10 BIL   [facility] Adult PT Treatment:                                                DATE: 06/16/2021 Therapeutic Exercise: Seated sciatic nerve glide R x 10 Supine sciatic nerve glide R x 10 Supine LTR x 10 Supine PPT x 5 - 5" hold Supine piriformis stretch x 30" R    PATIENT EDUCATION:  Education details: eval findings, HEP, POC Person educated: Patient Education method: Explanation, Demonstration, and Handouts Education comprehension: verbalized understanding and returned demonstration     HOME EXERCISE PROGRAM: Access Code: [email or web address] URL: [email or web address] Date: 06/16/2021 Prepared by: [name]   Exercises - Seated Sciatic Tensioner  - 1 x daily - 7 x weekly - 2 sets - 10 reps - Supine Sciatic Nerve Glide  - 1 x daily - 7 x weekly - 2 sets - 10 reps - Supine Lower Trunk Rotation  - 1 x daily - 7 x weekly - 2 sets - 10 reps - Supine Posterior Pelvic Tilt  - 1 x daily - 7 x weekly - 2 sets - 10 reps - 5 sec hold - Supine Piriformis Stretch with Leg Straight  - 1 x daily - 7 x weekly - 2 reps - 30 sec hold   ASSESSMENT:   CLINICAL IMPRESSION: Pt was able to complete all prescribed exercises with no adverse effect or increase in pain. Therapy today continued to focus on increasing core and proximal hip muscle strength in order to decrease pain and improve functional mobility. She continues to progress as expected with therapy and will continue to be seen and progressed as able per POC.      OBJECTIVE IMPAIRMENTS decreased balance, decreased endurance, decreased mobility, difficulty walking, decreased ROM, decreased strength, improper body mechanics, postural dysfunction, and pain.    ACTIVITY LIMITATIONS cleaning, community activity, laundry, and yard work.    PERSONAL FACTORS Time since onset of injury/illness/exacerbation and 1 comorbidity: HTN  are also affecting patient's functional outcome.     GOALS: Goals reviewed with patient? No   SHORT TERM GOALS: Target date: 07/08/2021     Pt will be compliant and knowledgeable with initial HEP for improved comfort and carryover Baseline: initial HEP given  Goal status: MET   2.  Pt will self report lower back  and right LE pain no greater than 6/10 for improved comfort and  functional ability Baseline: 7/10 at worst Goal status: INITIAL   LONG TERM GOALS: Target date: 08/11/2021     Pt will improve FOTO function score to no less than predicted value as proxy for functional improvement Baseline: will assess next session Goal status: INITIAL   2.  Pt will self report lower back and right LE pain no greater than 2/10 for improved comfort and functional ability Baseline: 7/10 at worst Goal status: INITIAL   3.  Pt will improve R LE MMT to no less than 4+/5 for all tested motions for improved functional mobility Baseline: see chart Goal status: INITIAL   4.  Pt will increase 30 Second Sit to Stand rep count to no less than 14 reps for improved balance, strength, and functional mobility Baseline: 12 reps  Goal status: INITIAL   PLAN: PT FREQUENCY: 2x/week   PT DURATION: 8 weeks   PLANNED INTERVENTIONS: Therapeutic exercises, Therapeutic activity, Neuromuscular re-education, Balance training, Gait training, Patient/Family education, Joint mobilization, Aquatic Therapy, Dry Needling, Electrical stimulation, Cryotherapy, Moist heat, Manual therapy, and Re-evaluation.   PLAN FOR NEXT SESSION: assess HEP response, pilates-based exercise, progress neutral spine strengthening, Nustep next session    Ward Chatters, PT 06/26/2021, 7:09 PM

## 2021-07-01 ENCOUNTER — Ambulatory Visit: Payer: Medicare Other

## 2021-07-01 NOTE — Therapy (Incomplete)
OUTPATIENT PHYSICAL THERAPY TREATMENT NOTE   Patient Name: Kathryn Mann MRN: [id number] DOB:27-Sep-1935, 86 y.o., female Today's Date: 07/01/2021  PCP: Ginger Organ., MD REFERRING PROVIDER: Dawley, Theodoro Doing, DO  END OF SESSION:      Past Medical History:  Diagnosis Date   GERD (gastroesophageal reflux disease)    Hypertension    Past Surgical History:  Procedure Laterality Date   ABDOMINAL HYSTERECTOMY     FOOT SURGERY     right foot surgery   There are no problems to display for this patient.   REFERRING DIAG: M48.061 (ICD-10-CM) - Spinal stenosis, lumbar region without neurogenic claudication  THERAPY DIAG:  No diagnosis found.  Rationale for Evaluation and Treatment Rehabilitation  PERTINENT HISTORY: HTN  PRECAUTIONS: None  SUBJECTIVE: *** Pt presents to PT with reports of continued lower back pain and discomfort. She has compliant with HEP with no adverse effect. Pt is ready to begin PT at this time.  PAIN:  Are you having pain?  No: NPRS scale: ***/10 (7/10 at worst) Pain location: lower back; R LE  Pain description: sharp, tingling Aggravating factors: prolonged sitting,  Relieving factors: medication   OBJECTIVE: (objective measures completed at initial evaluation unless otherwise dated)  PATIENT SURVEYS:  FOTO - will assess next session   COGNITION:           Overall cognitive status: Within functional limits for tasks assessed                          SENSATION: WFL   POSTURE:  Small body habitus, increased thoracic kyphosis, fwd head   PALPATION: TTP to R lumbar paraspinals   LE MMT:   MMT Right 06/16/2021 Left 06/16/2021  Hip flexion  3+/5 4/5  Hip extension      Hip abduction 3+/5 4/5  Hip adduction 3+/5 4/5  Hip external rotation      Hip internal rotation      Knee extension 4/5 4/5  Knee flexion 3+/5 4/5  Ankle dorsiflexion       Ankle plantarflexion      Ankle inversion      Ankle eversion      Grossly      (Blank  rows = not tested)   LUMBAR SPECIAL TESTS:  Straight leg raise test: Negative and Slump test: Positive   FUNCTIONAL TESTS:  30 Second Sit to Stand: 12 reps   GAIT: Distance walked: 2f Assistive device utilized: None Level of assistance: Complete Independence Comments: decreased trunk extension, slowed gait speed       TODAY'S TREATMENT  [facility] Adult PT Treatment:                                                DATE: 07/01/2021 Therapeutic Exercise: NuStep lvl 5 UE/LE x 4 min while taking subjective  Supine sciatic nerve glide x 20 Supine PPT x 10 - 5" hold  Supine PPT with ball squeeze x 10 - 5" hold  Supine PPT with march x 10  Supine 90/90 3x10" Supine hundreds 2x20 Supine SLR (small range) 2x10 each Supine clamshells GTB 2x20 Standing hip abd/ext 2x10 YTB Leg press 2x10 35# STS with 5# KB 2x10 Omega knee flexion 20# 2x10   [facility] Adult PT Treatment:  DATE: 06/26/2021 Therapeutic Exercise: NuStep lvl 5 UE/LE x 4 min while taking subjective  Supine sciatic nerve glide x 20 Supine PPT x 10 - 5" hold  Supine PPT with ball squeeze x 10 - 5" hold  Supine PPT with march x 10  Supine 90/90 3x10" Supine hundreds 2x20 Supine SLR (small range) 2x10 each Supine clamshells GTB 2x20 Standing hip abd/ext 2x10 YTB Pilates springboard pulldown 2x10 Yellow Pilates springboard row 2x10 Yellow Leg press 2x10 35#  [facility] Adult PT Treatment:                                                DATE: 06/23/2021 Therapeutic Exercise: Supine sciatic nerve glide x20 Supine clamshells GTB 2x10 Supine march GTB 2x10 BIL Supine PPT 5" hold 2x10 Supine figure 4 piriformis stretch 2x30" Supine hamstring stretch with strap 2x30" BIL SLR x10 BIL LTR x10 BIL Seated hamstring curl GTB 2x10 BIL LAQ 2x10 BIL    PATIENT EDUCATION:  Education details: eval findings, HEP, POC Person educated: Patient Education method: Explanation, Demonstration, and  Handouts Education comprehension: verbalized understanding and returned demonstration     HOME EXERCISE PROGRAM: Access Code: [email or web address] URL: [email or web address] Date: 06/16/2021 Prepared by: [name]   Exercises - Seated Sciatic Tensioner  - 1 x daily - 7 x weekly - 2 sets - 10 reps - Supine Sciatic Nerve Glide  - 1 x daily - 7 x weekly - 2 sets - 10 reps - Supine Lower Trunk Rotation  - 1 x daily - 7 x weekly - 2 sets - 10 reps - Supine Posterior Pelvic Tilt  - 1 x daily - 7 x weekly - 2 sets - 10 reps - 5 sec hold - Supine Piriformis Stretch with Leg Straight  - 1 x daily - 7 x weekly - 2 reps - 30 sec hold   ASSESSMENT:   CLINICAL IMPRESSION: ***  Pt was able to complete all prescribed exercises with no adverse effect or increase in pain. Therapy today continued to focus on increasing core and proximal hip muscle strength in order to decrease pain and improve functional mobility. She continues to progress as expected with therapy and will continue to be seen and progressed as able per POC.      OBJECTIVE IMPAIRMENTS decreased balance, decreased endurance, decreased mobility, difficulty walking, decreased ROM, decreased strength, improper body mechanics, postural dysfunction, and pain.    ACTIVITY LIMITATIONS cleaning, community activity, laundry, and yard work.    PERSONAL FACTORS Time since onset of injury/illness/exacerbation and 1 comorbidity: HTN  are also affecting patient's functional outcome.     GOALS: Goals reviewed with patient? No   SHORT TERM GOALS: Target date: 07/08/2021     Pt will be compliant and knowledgeable with initial HEP for improved comfort and carryover Baseline: initial HEP given  Goal status: MET   2.  Pt will self report lower back and right LE pain no greater than 6/10 for improved comfort and functional ability Baseline: 7/10 at worst Goal status: INITIAL   LONG TERM GOALS: Target date: 08/11/2021     Pt will improve FOTO  function score to no less than predicted value as proxy for functional improvement Baseline: will assess next session Goal status: INITIAL   2.  Pt will self report lower back and right LE pain no greater than 2/10 for improved  comfort and functional ability Baseline: 7/10 at worst Goal status: INITIAL   3.  Pt will improve R LE MMT to no less than 4+/5 for all tested motions for improved functional mobility Baseline: see chart Goal status: INITIAL   4.  Pt will increase 30 Second Sit to Stand rep count to no less than 14 reps for improved balance, strength, and functional mobility Baseline: 12 reps  Goal status: INITIAL   PLAN: PT FREQUENCY: 2x/week   PT DURATION: 8 weeks   PLANNED INTERVENTIONS: Therapeutic exercises, Therapeutic activity, Neuromuscular re-education, Balance training, Gait training, Patient/Family education, Joint mobilization, Aquatic Therapy, Dry Needling, Electrical stimulation, Cryotherapy, Moist heat, Manual therapy, and Re-evaluation.   PLAN FOR NEXT SESSION: assess HEP response, pilates-based exercise, progress neutral spine strengthening, Nustep next session    Evelene Croon, PTA 07/01/2021, 9:01 AM

## 2021-07-03 ENCOUNTER — Ambulatory Visit: Payer: Medicare Other | Attending: Neurological Surgery

## 2021-07-03 DIAGNOSIS — M5459 Other low back pain: Secondary | ICD-10-CM | POA: Diagnosis not present

## 2021-07-03 DIAGNOSIS — M6281 Muscle weakness (generalized): Secondary | ICD-10-CM | POA: Insufficient documentation

## 2021-07-03 NOTE — Therapy (Signed)
OUTPATIENT PHYSICAL THERAPY TREATMENT NOTE   Patient Name: Kathryn Mann MRN: [id number] DOB:1935-08-10, 86 y.o., female Today's Date: 07/03/2021  PCP: Ginger Organ., MD REFERRING PROVIDER: Dawley, Theodoro Doing, DO  END OF SESSION:   PT End of Session - 07/03/21 1006     Visit Number 4    Number of Visits 17    Date for PT Re-Evaluation 08/11/21    Authorization Type Medicare    PT Start Time 1006    PT Stop Time 1044    PT Time Calculation (min) 38 min    Activity Tolerance Patient tolerated treatment well    Behavior During Therapy WFL for tasks assessed/performed               Past Medical History:  Diagnosis Date   GERD (gastroesophageal reflux disease)    Hypertension    Past Surgical History:  Procedure Laterality Date   ABDOMINAL HYSTERECTOMY     FOOT SURGERY     right foot surgery   There are no problems to display for this patient.   REFERRING DIAG: M48.061 (ICD-10-CM) - Spinal stenosis, lumbar region without neurogenic claudication  THERAPY DIAG:  Other low back pain  Muscle weakness (generalized)  Rationale for Evaluation and Treatment Rehabilitation  PERTINENT HISTORY: HTN  PRECAUTIONS: None  SUBJECTIVE:  Pt presents to PT with reports of decreased back and LE pain. Pt has been compliant with her HEP with no adverse effect. Pt is ready to begin PT treatment at this time.   PAIN:  Are you having pain?  No: NPRS scale: 3/10 (7/10 at worst) Pain location: lower back; R LE  Pain description: sharp, tingling Aggravating factors: prolonged sitting,  Relieving factors: medication   OBJECTIVE: (objective measures completed at initial evaluation unless otherwise dated)  PATIENT SURVEYS:  FOTO   COGNITION:           Overall cognitive status: Within functional limits for tasks assessed                          SENSATION: WFL   POSTURE:  Small body habitus, increased thoracic kyphosis, fwd head   PALPATION: TTP to R lumbar  paraspinals   LE MMT:   MMT Right 06/16/2021 Left 06/16/2021  Hip flexion  3+/5 4/5  Hip extension      Hip abduction 3+/5 4/5  Hip adduction 3+/5 4/5  Hip external rotation      Hip internal rotation      Knee extension 4/5 4/5  Knee flexion 3+/5 4/5  Ankle dorsiflexion       Ankle plantarflexion      Ankle inversion      Ankle eversion      Grossly      (Blank rows = not tested)   LUMBAR SPECIAL TESTS:  Straight leg raise test: Negative and Slump test: Positive   FUNCTIONAL TESTS:  30 Second Sit to Stand: 12 reps   GAIT: Distance walked: 58f Assistive device utilized: None Level of assistance: Complete Independence Comments: decreased trunk extension, slowed gait speed       TODAY'S TREATMENT  [facility] Adult PT Treatment:                                                DATE: 07/03/2021 Therapeutic Exercise: Supine sciatic nerve  glide x 20 Supine PPT x 10 - 5" hold  Supine PPT with ball squeeze 2x10 - 5" hold  Supine PPT with pulldown 2x10 BTB Supine PPT with hundreds 3x20  Supine clamshell 3x15 black TB Supine SLR (small range) 2x10 each Step ups 2x10 6in each in // Lateral walk at counter RTB x 1 lap Standing hip abd/ext 2x10 RTB Standing mini squat with UE support x 10  [facility] Adult PT Treatment:                                                DATE: 06/26/2021 Therapeutic Exercise: NuStep lvl 5 UE/LE x 4 min while taking subjective  Supine sciatic nerve glide x 20 Supine PPT x 10 - 5" hold  Supine PPT with ball squeeze x 10 - 5" hold  Supine PPT with march x 10  Supine 90/90 3x10" Supine hundreds 2x20 Supine SLR (small range) 2x10 each Supine clamshells GTB 2x20 Standing hip abd/ext 2x10 YTB Pilates springboard pulldown 2x10 Yellow Pilates springboard row 2x10 Yellow Leg press 2x10 35#  [facility] Adult PT Treatment:                                                DATE: 06/23/2021 Therapeutic Exercise: Supine sciatic nerve glide x20 Supine clamshells GTB  2x10 Supine march GTB 2x10 BIL Supine PPT 5" hold 2x10 Supine figure 4 piriformis stretch 2x30" Supine hamstring stretch with strap 2x30" BIL SLR x10 BIL LTR x10 BIL Seated hamstring curl GTB 2x10 BIL LAQ 2x10 BIL   [facility] Adult PT Treatment:                                                DATE: 06/16/2021 Therapeutic Exercise: Seated sciatic nerve glide R x 10 Supine sciatic nerve glide R x 10 Supine LTR x 10 Supine PPT x 5 - 5" hold Supine piriformis stretch x 30" R   PATIENT EDUCATION:  Education details: eval findings, HEP, POC Person educated: Patient Education method: Explanation, Demonstration, and Handouts Education comprehension: verbalized understanding and returned demonstration     HOME EXERCISE PROGRAM: Access Code: [email or web address] URL: [email or web address] Date: 06/16/2021 Prepared by: [name]   Exercises - Seated Sciatic Tensioner  - 1 x daily - 7 x weekly - 2 sets - 10 reps - Supine Sciatic Nerve Glide  - 1 x daily - 7 x weekly - 2 sets - 10 reps - Supine Lower Trunk Rotation  - 1 x daily - 7 x weekly - 2 sets - 10 reps - Supine Posterior Pelvic Tilt  - 1 x daily - 7 x weekly - 2 sets - 10 reps - 5 sec hold - Supine Piriformis Stretch with Leg Straight  - 1 x daily - 7 x weekly - 2 reps - 30 sec hold   ASSESSMENT:   CLINICAL IMPRESSION: Pt was again able to complete all prescribed exercises with no adverse effect or increase in pain. Therapy again focused on improving core and proximal hip strength in order to decrease pain and improve functional ability. She continues to benefit from  skilled PT services and will continue to be seen and progressed as able per POC.      OBJECTIVE IMPAIRMENTS decreased balance, decreased endurance, decreased mobility, difficulty walking, decreased ROM, decreased strength, improper body mechanics, postural dysfunction, and pain.    ACTIVITY LIMITATIONS cleaning, community activity, laundry, and yard work.     PERSONAL FACTORS Time since onset of injury/illness/exacerbation and 1 comorbidity: HTN  are also affecting patient's functional outcome.     GOALS: Goals reviewed with patient? No   SHORT TERM GOALS: Target date: 07/08/2021     Pt will be compliant and knowledgeable with initial HEP for improved comfort and carryover Baseline: initial HEP given  Goal status: MET   2.  Pt will self report lower back and right LE pain no greater than 6/10 for improved comfort and functional ability Baseline: 7/10 at worst Goal status: INITIAL   LONG TERM GOALS: Target date: 08/11/2021     Pt will improve FOTO function score to no less than predicted value as proxy for functional improvement Baseline:  Goal status: INITIAL   2.  Pt will self report lower back and right LE pain no greater than 2/10 for improved comfort and functional ability Baseline: 7/10 at worst Goal status: INITIAL   3.  Pt will improve R LE MMT to no less than 4+/5 for all tested motions for improved functional mobility Baseline: see chart Goal status: INITIAL   4.  Pt will increase 30 Second Sit to Stand rep count to no less than 14 reps for improved balance, strength, and functional mobility Baseline: 12 reps  Goal status: INITIAL   PLAN: PT FREQUENCY: 2x/week   PT DURATION: 8 weeks   PLANNED INTERVENTIONS: Therapeutic exercises, Therapeutic activity, Neuromuscular re-education, Balance training, Gait training, Patient/Family education, Joint mobilization, Aquatic Therapy, Dry Needling, Electrical stimulation, Cryotherapy, Moist heat, Manual therapy, and Re-evaluation.   PLAN FOR NEXT SESSION: assess HEP response, pilates-based exercise, progress neutral spine strengthening, Nustep next session    Ward Chatters, PT 07/03/2021, 10:44 AM

## 2021-07-07 ENCOUNTER — Ambulatory Visit: Payer: Medicare Other

## 2021-07-07 DIAGNOSIS — M6281 Muscle weakness (generalized): Secondary | ICD-10-CM

## 2021-07-07 DIAGNOSIS — M5459 Other low back pain: Secondary | ICD-10-CM

## 2021-07-07 NOTE — Therapy (Signed)
OUTPATIENT PHYSICAL THERAPY TREATMENT NOTE   Patient Name: Kathryn Mann MRN: [id number] DOB:Aug 18, 1935, 86 y.o., female Today's Date: 07/08/2021  PCP: Kathryn Mann., MD REFERRING PROVIDER: Dawley, Theodoro Doing, DO  END OF SESSION:   PT End of Session - 07/07/21 1410     Visit Number 5    Number of Visits 17    Date for PT Re-Evaluation 08/11/21    Authorization Type Medicare    PT Start Time 1540    PT Stop Time 1445    PT Time Calculation (min) 40 min    Activity Tolerance Patient tolerated treatment well    Behavior During Therapy WFL for tasks assessed/performed                Past Medical History:  Diagnosis Date   GERD (gastroesophageal reflux disease)    Hypertension    Past Surgical History:  Procedure Laterality Date   ABDOMINAL HYSTERECTOMY     FOOT SURGERY     right foot surgery   There are no problems to display for this patient.   REFERRING DIAG: M48.061 (ICD-10-CM) - Spinal stenosis, lumbar region without neurogenic claudication  THERAPY DIAG:  Other low back pain  Muscle weakness (generalized)  Rationale for Evaluation and Treatment Rehabilitation  PERTINENT HISTORY: HTN  PRECAUTIONS: None  SUBJECTIVE:  Pt presents to PT with continued reports of back and LE pain. She has been compliant with HEP with no adverse effect or increase in pain. She is ready to begin PT at this time.   PAIN:  Are you having pain?  No: NPRS scale: 3/10 (7/10 at worst) Pain location: lower back; R LE  Pain description: sharp, tingling Aggravating factors: prolonged sitting,  Relieving factors: medication   OBJECTIVE: (objective measures completed at initial evaluation unless otherwise dated)  PATIENT SURVEYS:  FOTO   COGNITION:           Overall cognitive status: Within functional limits for tasks assessed                          SENSATION: WFL   POSTURE:  Small body habitus, increased thoracic kyphosis, fwd head   PALPATION: TTP to R lumbar  paraspinals   LE MMT:   MMT Right 06/16/2021 Left 06/16/2021  Hip flexion  3+/5 4/5  Hip extension      Hip abduction 3+/5 4/5  Hip adduction 3+/5 4/5  Hip external rotation      Hip internal rotation      Knee extension 4/5 4/5  Knee flexion 3+/5 4/5  Ankle dorsiflexion       Ankle plantarflexion      Ankle inversion      Ankle eversion      Grossly      (Blank rows = not tested)   LUMBAR SPECIAL TESTS:  Straight leg raise test: Negative and Slump test: Positive   FUNCTIONAL TESTS:  30 Second Sit to Stand: 12 reps   GAIT: Distance walked: 45f Assistive device utilized: None Level of assistance: Complete Independence Comments: decreased trunk extension, slowed gait speed       TODAY'S TREATMENT  [facility] Adult PT Treatment:                                                DATE: 07/07/2021 Therapeutic Exercise:  Supine sciatic nerve glide x 20 Supine PPT x 10 - 5" hold  Supine PPT with ball squeeze 2x10 - 5" hold  Supine PPT with pulldown 2x10 BTB Supine PPT with hundreds 3x20  Supine PPT 90/90 hold 3x10" Supine PPT 90/90 alt taps x 10  Supine clamshell 2x20 BTB  Supine SLR (small range) 2x15 each Step ups 2x10 8in each in // Lateral walk at // RTB x 2 laps  [facility] Adult PT Treatment:                                                DATE: 07/03/2021 Therapeutic Exercise: Supine sciatic nerve glide x 20 Supine PPT x 10 - 5" hold  Supine PPT with ball squeeze 2x10 - 5" hold  Supine PPT with pulldown 2x10 BTB Supine PPT with hundreds 3x20  Supine clamshell 3x15 black TB Supine SLR (small range) 2x10 each Step ups 2x10 6in each in // Lateral walk at counter RTB x 1 lap Standing hip abd/ext 2x10 RTB Standing mini squat with UE support x 10  [facility] Adult PT Treatment:                                                DATE: 06/26/2021 Therapeutic Exercise: NuStep lvl 5 UE/LE x 4 min while taking subjective  Supine sciatic nerve glide x 20 Supine PPT x 10 - 5" hold  Supine PPT  with ball squeeze x 10 - 5" hold  Supine PPT with march x 10  Supine 90/90 3x10" Supine hundreds 2x20 Supine SLR (small range) 2x10 each Supine clamshells GTB 2x20 Standing hip abd/ext 2x10 YTB Pilates springboard pulldown 2x10 Yellow Pilates springboard row 2x10 Yellow Leg press 2x10 35#    PATIENT EDUCATION:  Education details: eval findings, HEP, POC Person educated: Patient Education method: Explanation, Demonstration, and Handouts Education comprehension: verbalized understanding and returned demonstration     HOME EXERCISE PROGRAM: Access Code: [email or web address] URL: [email or web address] Date: 06/16/2021 Prepared by: Kathryn Mann   Exercises - Seated Sciatic Tensioner  - 1 x daily - 7 x weekly - 2 sets - 10 reps - Supine Sciatic Nerve Glide  - 1 x daily - 7 x weekly - 2 sets - 10 reps - Supine Lower Trunk Rotation  - 1 x daily - 7 x weekly - 2 sets - 10 reps - Supine Posterior Pelvic Tilt  - 1 x daily - 7 x weekly - 2 sets - 10 reps - 5 sec hold - Supine Piriformis Stretch with Leg Straight  - 1 x daily - 7 x weekly - 2 reps - 30 sec hold   ASSESSMENT:   CLINICAL IMPRESSION: Pt was able to complete all prescribed exercises with no adverse effect or increase in pain. Therapy today focused on improving core and proximal hip strength in order to decrease pain and improve functional mobility. Pt continues to benefit from skilled PT and will continue to be seen and progressed as tolerated.      OBJECTIVE IMPAIRMENTS decreased balance, decreased endurance, decreased mobility, difficulty walking, decreased ROM, decreased strength, improper body mechanics, postural dysfunction, and pain.    ACTIVITY LIMITATIONS cleaning, community activity, laundry, and yard work.    PERSONAL FACTORS Time since  onset of injury/illness/exacerbation and 1 comorbidity: HTN  are also affecting patient's functional outcome.     GOALS: Goals reviewed with patient? No   SHORT TERM GOALS:  Target date: 07/08/2021     Pt will be compliant and knowledgeable with initial HEP for improved comfort and carryover Baseline: initial HEP given  Goal status: MET   2.  Pt will self report lower back and right LE pain no greater than 6/10 for improved comfort and functional ability Baseline: 7/10 at worst Goal status: INITIAL   LONG TERM GOALS: Target date: 08/11/2021     Pt will improve FOTO function score to no less than predicted value as proxy for functional improvement Baseline:  Goal status: INITIAL   2.  Pt will self report lower back and right LE pain no greater than 2/10 for improved comfort and functional ability Baseline: 7/10 at worst Goal status: INITIAL   3.  Pt will improve R LE MMT to no less than 4+/5 for all tested motions for improved functional mobility Baseline: see chart Goal status: INITIAL   4.  Pt will increase 30 Second Sit to Stand rep count to no less than 14 reps for improved balance, strength, and functional mobility Baseline: 12 reps  Goal status: INITIAL   PLAN: PT FREQUENCY: 2x/week   PT DURATION: 8 weeks   PLANNED INTERVENTIONS: Therapeutic exercises, Therapeutic activity, Neuromuscular re-education, Balance training, Gait training, Patient/Family education, Joint mobilization, Aquatic Therapy, Dry Needling, Electrical stimulation, Cryotherapy, Moist heat, Manual therapy, and Re-evaluation.   PLAN FOR NEXT SESSION: assess HEP response, pilates-based exercise, progress neutral spine strengthening, Nustep next session    Ward Chatters, PT 07/08/2021, 10:15 AM

## 2021-07-09 DIAGNOSIS — Z6825 Body mass index (BMI) 25.0-25.9, adult: Secondary | ICD-10-CM | POA: Diagnosis not present

## 2021-07-09 DIAGNOSIS — M48061 Spinal stenosis, lumbar region without neurogenic claudication: Secondary | ICD-10-CM | POA: Diagnosis not present

## 2021-07-14 ENCOUNTER — Ambulatory Visit: Payer: Medicare Other

## 2021-07-14 ENCOUNTER — Telehealth: Payer: Self-pay

## 2021-07-14 NOTE — Telephone Encounter (Signed)
PT called and spoke with patient. She got her days mixed up and missed session.   Confirmed next appointment and reviewed attendance policy.   Ward Chatters   07/14/21 2:50 PM

## 2021-07-14 NOTE — Therapy (Incomplete)
OUTPATIENT PHYSICAL THERAPY TREATMENT NOTE   Patient Name: Kathryn Mann MRN: [id number] DOB:1935/03/29, 86 y.o., female Today's Date: 07/14/2021  PCP: Ginger Organ., MD REFERRING PROVIDER: Dawley, Theodoro Doing, DO  END OF SESSION:        Past Medical History:  Diagnosis Date   GERD (gastroesophageal reflux disease)    Hypertension    Past Surgical History:  Procedure Laterality Date   ABDOMINAL HYSTERECTOMY     FOOT SURGERY     right foot surgery   There are no problems to display for this patient.   REFERRING DIAG: M48.061 (ICD-10-CM) - Spinal stenosis, lumbar region without neurogenic claudication  THERAPY DIAG:  No diagnosis found.  Rationale for Evaluation and Treatment Rehabilitation  PERTINENT HISTORY: HTN  PRECAUTIONS: None  SUBJECTIVE:  ***  PAIN:  Are you having pain?  No: NPRS scale: 3/10 (7/10 at worst) Pain location: lower back; R LE  Pain description: sharp, tingling Aggravating factors: prolonged sitting,  Relieving factors: medication   OBJECTIVE: (objective measures completed at initial evaluation unless otherwise dated)  PATIENT SURVEYS:  FOTO   COGNITION:           Overall cognitive status: Within functional limits for tasks assessed                          SENSATION: WFL   POSTURE:  Small body habitus, increased thoracic kyphosis, fwd head   PALPATION: TTP to R lumbar paraspinals   LE MMT:   MMT Right 06/16/2021 Left 06/16/2021  Hip flexion  3+/5 4/5  Hip extension      Hip abduction 3+/5 4/5  Hip adduction 3+/5 4/5  Hip external rotation      Hip internal rotation      Knee extension 4/5 4/5  Knee flexion 3+/5 4/5  Ankle dorsiflexion       Ankle plantarflexion      Ankle inversion      Ankle eversion      Grossly      (Blank rows = not tested)   LUMBAR SPECIAL TESTS:  Straight leg raise test: Negative and Slump test: Positive   FUNCTIONAL TESTS:  30 Second Sit to Stand: 12 reps   GAIT: Distance  walked: 92f Assistive device utilized: None Level of assistance: Complete Independence Comments: decreased trunk extension, slowed gait speed       TODAY'S TREATMENT  [facility] Adult PT Treatment:                                                DATE: 07/14/2021 Therapeutic Exercise: Supine sciatic nerve glide x 20 Supine PPT x 10 - 5" hold  Supine PPT with ball squeeze 2x10 - 5" hold  Supine PPT with pulldown 2x10 BTB Supine PPT with hundreds 3x20  Supine PPT 90/90 hold 3x10" Supine PPT 90/90 alt taps x 10  Supine clamshell 2x20 BTB  Supine SLR (small range) 2x15 each Step ups 2x10 8in each in // Lateral walk at // RTB x 2 laps  [facility]  [facility] Therapeutic Exercise: Supine sciatic nerve glide x 20 Supine PPT x 10 - 5" hold  Supine PPT with ball squeeze 2x10 - 5" hold  Supine PPT with pulldown 2x10 BTB Supine PPT with hundreds 3x20  Supine PPT 90/90 hold 3x10" Supine PPT 90/90 alt taps x 10  Supine clamshell 2x20 BTB  Supine SLR (small range) 2x15 each Step ups 2x10 8in each in // Lateral walk at // RTB x 2 laps  [facility] Adult PT Treatment:                                                DATE: 07/03/2021 Therapeutic Exercise: Supine sciatic nerve glide x 20 Supine PPT x 10 - 5" hold  Supine PPT with ball squeeze 2x10 - 5" hold  Supine PPT with pulldown 2x10 BTB Supine PPT with hundreds 3x20  Supine clamshell 3x15 black TB Supine SLR (small range) 2x10 each Step ups 2x10 6in each in // Lateral walk at counter RTB x 1 lap Standing hip abd/ext 2x10 RTB Standing mini squat with UE support x 10   PATIENT EDUCATION:  Education details: eval findings, HEP, POC Person educated: Patient Education method: Explanation, Demonstration, and Handouts Education comprehension: verbalized understanding and returned demonstration     HOME EXERCISE PROGRAM: Access Code: [email or web address] URL:  [email or web address] Date: 06/16/2021 Prepared by: [name]   Exercises - Seated Sciatic Tensioner  - 1 x daily - 7 x weekly - 2 sets - 10 reps - Supine Sciatic Nerve Glide  - 1 x daily - 7 x weekly - 2 sets - 10 reps - Supine Lower Trunk Rotation  - 1 x daily - 7 x weekly - 2 sets - 10 reps - Supine Posterior Pelvic Tilt  - 1 x daily - 7 x weekly - 2 sets - 10 reps - 5 sec hold - Supine Piriformis Stretch with Leg Straight  - 1 x daily - 7 x weekly - 2 reps - 30 sec hold   ASSESSMENT:   CLINICAL IMPRESSION: ***     OBJECTIVE IMPAIRMENTS decreased balance, decreased endurance, decreased mobility, difficulty walking, decreased ROM, decreased strength, improper body mechanics, postural dysfunction, and pain.    ACTIVITY LIMITATIONS cleaning, community activity, laundry, and yard work.    PERSONAL FACTORS Time since onset of injury/illness/exacerbation and 1 comorbidity: HTN  are also affecting patient's functional outcome.     GOALS: Goals reviewed with patient? No   SHORT TERM GOALS: Target date: 07/08/2021     Pt will be compliant and knowledgeable with initial HEP for improved comfort and carryover Baseline: initial HEP given  Goal status: MET   2.  Pt will self report lower back and right LE pain no greater than 6/10 for improved comfort and functional ability Baseline: 7/10 at worst Goal status: INITIAL   LONG TERM GOALS: Target date: 08/11/2021     Pt will improve FOTO function score to no less than predicted value as proxy for functional improvement Baseline:  Goal status: INITIAL   2.  Pt will self report lower back and right LE pain no greater than 2/10 for improved comfort and functional ability Baseline: 7/10 at worst Goal status: INITIAL   3.  Pt will improve R LE MMT to no less than 4+/5 for all tested motions for improved functional mobility Baseline: see chart Goal status: INITIAL  4.  Pt will increase 30 Second Sit to Stand rep count  to no less than 14 reps for improved balance, strength, and functional mobility Baseline: 12 reps  Goal status: INITIAL   PLAN: PT FREQUENCY: 2x/week   PT DURATION: 8 weeks   PLANNED INTERVENTIONS: Therapeutic exercises, Therapeutic activity, Neuromuscular re-education, Balance training, Gait training, Patient/Family education, Joint mobilization, Aquatic Therapy, Dry Needling, Electrical stimulation, Cryotherapy, Moist heat, Manual therapy, and Re-evaluation.   PLAN FOR NEXT SESSION: assess HEP response, pilates-based exercise, progress neutral spine strengthening, Nustep next session    Ward Chatters, PT 07/14/2021, 12:08 PM

## 2021-07-17 ENCOUNTER — Ambulatory Visit: Payer: Medicare Other

## 2021-07-17 DIAGNOSIS — M6281 Muscle weakness (generalized): Secondary | ICD-10-CM | POA: Diagnosis not present

## 2021-07-17 DIAGNOSIS — M5459 Other low back pain: Secondary | ICD-10-CM | POA: Diagnosis not present

## 2021-07-17 NOTE — Therapy (Signed)
OUTPATIENT PHYSICAL THERAPY TREATMENT NOTE   Patient Name: Kathryn Mann MRN: [id number] DOB:Apr 28, 1935, 86 y.o., female Today's Date: 07/17/2021  PCP: Ginger Organ., MD REFERRING PROVIDER: Dawley, Theodoro Doing, DO  END OF SESSION:   PT End of Session - 07/17/21 1621     Visit Number 6    Number of Visits 17    Date for PT Re-Evaluation 08/11/21    Authorization Type Medicare    PT Start Time 1620    PT Stop Time 1700    PT Time Calculation (min) 40 min    Activity Tolerance Patient tolerated treatment well    Behavior During Therapy WFL for tasks assessed/performed                 Past Medical History:  Diagnosis Date   GERD (gastroesophageal reflux disease)    Hypertension    Past Surgical History:  Procedure Laterality Date   ABDOMINAL HYSTERECTOMY     FOOT SURGERY     right foot surgery   There are no problems to display for this patient.   REFERRING DIAG: M48.061 (ICD-10-CM) - Spinal stenosis, lumbar region without neurogenic claudication  THERAPY DIAG:  Other low back pain  Muscle weakness (generalized)  Rationale for Evaluation and Treatment Rehabilitation  PERTINENT HISTORY: HTN  PRECAUTIONS: None  SUBJECTIVE: Patient reports that the weather is affecting her pain. She states her back pain is ok today and most of her pain is in her RLE.  PAIN:  Are you having pain?  No: NPRS scale: 7/10 (7/10 at worst) Pain location: lower back; R LE  Pain description: sharp, tingling Aggravating factors: prolonged sitting,  Relieving factors: medication   OBJECTIVE: (objective measures completed at initial evaluation unless otherwise dated)  PATIENT SURVEYS:  FOTO   COGNITION:           Overall cognitive status: Within functional limits for tasks assessed                          SENSATION: WFL   POSTURE:  Small body habitus, increased thoracic kyphosis, fwd head   PALPATION: TTP to R lumbar paraspinals   LE MMT:   MMT Right 06/16/2021  Left 06/16/2021  Hip flexion  3+/5 4/5  Hip extension      Hip abduction 3+/5 4/5  Hip adduction 3+/5 4/5  Hip external rotation      Hip internal rotation      Knee extension 4/5 4/5  Knee flexion 3+/5 4/5  Ankle dorsiflexion       Ankle plantarflexion      Ankle inversion      Ankle eversion      Grossly      (Blank rows = not tested)   LUMBAR SPECIAL TESTS:  Straight leg raise test: Negative and Slump test: Positive   FUNCTIONAL TESTS:  30 Second Sit to Stand: 12 reps   GAIT: Distance walked: 57f Assistive device utilized: None Level of assistance: Complete Independence Comments: decreased trunk extension, slowed gait speed       TODAY'S TREATMENT  [facility] Adult PT Treatment:                                                DATE: 07/17/2021 Therapeutic Exercise: Nustep level 2 x 3 mins (pt req to terminate  early due to pain in R LE) Bridges 2x10 Supine sciatic nerve glide x 20 Rt Supine PPT with ball squeeze 2x10 - 5" hold  Supine PPT with pulldown 2x10 BTB Supine PPT 90/90 hold 2x20" Supine clamshell 2x15 BTB  Supine SLR (small range) 2x10 each   [facility] Adult PT Treatment:                                                DATE: 07/07/2021 Therapeutic Exercise: Supine sciatic nerve glide x 20 Supine PPT x 10 - 5" hold  Supine PPT with ball squeeze 2x10 - 5" hold  Supine PPT with pulldown 2x10 BTB Supine PPT with hundreds 3x20  Supine PPT 90/90 hold 3x10" Supine PPT 90/90 alt taps x 10  Supine clamshell 2x20 BTB  Supine SLR (small range) 2x15 each Step ups 2x10 8in each in // Lateral walk at // RTB x 2 laps  [facility] Adult PT Treatment:                                                DATE: 07/03/2021 Therapeutic Exercise: Supine sciatic nerve glide x 20 Supine PPT x 10 - 5" hold  Supine PPT with ball squeeze 2x10 - 5" hold  Supine PPT with pulldown 2x10 BTB Supine PPT with hundreds 3x20  Supine clamshell 3x15 black TB Supine SLR (small range) 2x10 each Step ups 2x10  6in each in // Lateral walk at counter RTB x 1 lap Standing hip abd/ext 2x10 RTB Standing mini squat with UE support x 10    PATIENT EDUCATION:  Education details: eval findings, HEP, POC Person educated: Patient Education method: Explanation, Demonstration, and Handouts Education comprehension: verbalized understanding and returned demonstration     HOME EXERCISE PROGRAM: Access Code: [email or web address] URL: [email or web address] Date: 06/16/2021 Prepared by: [name]   Exercises - Seated Sciatic Tensioner  - 1 x daily - 7 x weekly - 2 sets - 10 reps - Supine Sciatic Nerve Glide  - 1 x daily - 7 x weekly - 2 sets - 10 reps - Supine Lower Trunk Rotation  - 1 x daily - 7 x weekly - 2 sets - 10 reps - Supine Posterior Pelvic Tilt  - 1 x daily - 7 x weekly - 2 sets - 10 reps - 5 sec hold - Supine Piriformis Stretch with Leg Straight  - 1 x daily - 7 x weekly - 2 reps - 30 sec hold   ASSESSMENT:   CLINICAL IMPRESSION: Patient presents to PT with pain in her RLE and reports her back pain is low today, she states she has been HEP compliant and that she has a Medical laboratory scientific officer" that comes over and makes sure she is doing her exercises correctly. Session today focused on proximal hip and core strengthening. She was limited by pain in her distal RLE this session, exercises regressed to accommodate increase in pain. Patient continues to benefit from skilled PT services and should be progressed as able to improve functional independence.     OBJECTIVE IMPAIRMENTS decreased balance, decreased endurance, decreased mobility, difficulty walking, decreased ROM, decreased strength, improper body mechanics, postural dysfunction, and pain.    ACTIVITY LIMITATIONS cleaning, community activity, laundry, and yard work.  PERSONAL FACTORS Time since onset of injury/illness/exacerbation and 1 comorbidity: HTN  are also affecting patient's functional outcome.     GOALS: Goals reviewed with patient?  No   SHORT TERM GOALS: Target date: 07/08/2021     Pt will be compliant and knowledgeable with initial HEP for improved comfort and carryover Baseline: initial HEP given  Goal status: MET   2.  Pt will self report lower back and right LE pain no greater than 6/10 for improved comfort and functional ability Baseline: 7/10 at worst Goal status: Ongoing   LONG TERM GOALS: Target date: 08/11/2021     Pt will improve FOTO function score to no less than predicted value as proxy for functional improvement Baseline:  Goal status: INITIAL   2.  Pt will self report lower back and right LE pain no greater than 2/10 for improved comfort and functional ability Baseline: 7/10 at worst Goal status: INITIAL   3.  Pt will improve R LE MMT to no less than 4+/5 for all tested motions for improved functional mobility Baseline: see chart Goal status: INITIAL   4.  Pt will increase 30 Second Sit to Stand rep count to no less than 14 reps for improved balance, strength, and functional mobility Baseline: 12 reps  Goal status: INITIAL   PLAN: PT FREQUENCY: 2x/week   PT DURATION: 8 weeks   PLANNED INTERVENTIONS: Therapeutic exercises, Therapeutic activity, Neuromuscular re-education, Balance training, Gait training, Patient/Family education, Joint mobilization, Aquatic Therapy, Dry Needling, Electrical stimulation, Cryotherapy, Moist heat, Manual therapy, and Re-evaluation.   PLAN FOR NEXT SESSION: assess HEP response, pilates-based exercise, progress neutral spine strengthening, Nustep next session    Evelene Croon, PTA 07/17/2021, 4:25 PM

## 2021-07-30 ENCOUNTER — Ambulatory Visit: Payer: Medicare Other

## 2021-07-30 DIAGNOSIS — M5459 Other low back pain: Secondary | ICD-10-CM | POA: Diagnosis not present

## 2021-07-30 DIAGNOSIS — M6281 Muscle weakness (generalized): Secondary | ICD-10-CM | POA: Diagnosis not present

## 2021-07-30 NOTE — Therapy (Signed)
OUTPATIENT PHYSICAL THERAPY TREATMENT NOTE   Patient Name: Kathryn Mann MRN: [id number] DOB:1935-04-05, 86 y.o., female Today's Date: 07/31/2021  PCP: Ginger Organ., MD REFERRING PROVIDER: Dawley, Theodoro Doing, DO  END OF SESSION:   PT End of Session - 07/30/21 1835     Visit Number 7    Number of Visits 17    Date for PT Re-Evaluation 08/11/21    Authorization Type Medicare    PT Start Time 8478    PT Stop Time 1913    PT Time Calculation (min) 38 min    Activity Tolerance Patient tolerated treatment well;Patient limited by pain    Behavior During Therapy WFL for tasks assessed/performed                  Past Medical History:  Diagnosis Date   GERD (gastroesophageal reflux disease)    Hypertension    Past Surgical History:  Procedure Laterality Date   ABDOMINAL HYSTERECTOMY     FOOT SURGERY     right foot surgery   There are no problems to display for this patient.   REFERRING DIAG: M48.061 (ICD-10-CM) - Spinal stenosis, lumbar region without neurogenic claudication  THERAPY DIAG:  Other low back pain  Muscle weakness (generalized)  Rationale for Evaluation and Treatment Rehabilitation  PERTINENT HISTORY: HTN  PRECAUTIONS: None  SUBJECTIVE:  Pt presents to PT with no current reports of pain  PAIN:  Are you having pain?  No: NPRS scale: 7/10 (7/10 at worst) Pain location: lower back; R LE  Pain description: sharp, tingling Aggravating factors: prolonged sitting,  Relieving factors: medication   OBJECTIVE: (objective measures completed at initial evaluation unless otherwise dated)  PATIENT SURVEYS:  FOTO   COGNITION:           Overall cognitive status: Within functional limits for tasks assessed                          SENSATION: WFL   POSTURE:  Small body habitus, increased thoracic kyphosis, fwd head   PALPATION: TTP to R lumbar paraspinals   LE MMT:   MMT Right 06/16/2021 Left 06/16/2021  Hip flexion  3+/5 4/5  Hip  extension      Hip abduction 3+/5 4/5  Hip adduction 3+/5 4/5  Hip external rotation      Hip internal rotation      Knee extension 4/5 4/5  Knee flexion 3+/5 4/5  Ankle dorsiflexion       Ankle plantarflexion      Ankle inversion      Ankle eversion      Grossly      (Blank rows = not tested)   LUMBAR SPECIAL TESTS:  Straight leg raise test: Negative and Slump test: Positive   FUNCTIONAL TESTS:  30 Second Sit to Stand: 12 reps   GAIT: Distance walked: 34f Assistive device utilized: None Level of assistance: Complete Independence Comments: decreased trunk extension, slowed gait speed       TODAY'S TREATMENT  [facility] Adult PT Treatment:                                                DATE: 07/30/2021 Therapeutic Exercise: Supine sciatic nerve glide x 20 Supine PPT x 10 - 5" hold  Supine PPT with ball squeeze 2x10 -  5" hold  Supine PPT with hundreds 3x20  Supine PPT 90/90 hold 3x15" Supine clamshell 2x15 GTB  Supine SLR (small range) 2x15 each Step ups 2x10 8in each in // Lateral walk at // RTB x 2 laps Standing hip abd/ext 2x10 RTB  [facility] Adult PT Treatment:                                                DATE: 07/17/2021 Therapeutic Exercise: Nustep level 2 x 3 mins (pt req to terminate early due to pain in R LE) Bridges 2x10 Supine sciatic nerve glide x 20 Rt Supine PPT with ball squeeze 2x10 - 5" hold  Supine PPT with pulldown 2x10 BTB Supine PPT 90/90 hold 2x20" Supine clamshell 2x15 BTB  Supine SLR (small range) 2x10 each   [facility] Adult PT Treatment:                                                DATE: 07/07/2021 Therapeutic Exercise: Supine sciatic nerve glide x 20 Supine PPT x 10 - 5" hold  Supine PPT with ball squeeze 2x10 - 5" hold  Supine PPT with pulldown 2x10 BTB Supine PPT with hundreds 3x20  Supine PPT 90/90 hold 3x10" Supine PPT 90/90 alt taps x 10  Supine clamshell 2x20 BTB  Supine SLR (small range) 2x15 each Step ups 2x10 8in each in // Lateral  walk at // RTB x 2 laps   PATIENT EDUCATION:  Education details: eval findings, HEP, POC Person educated: Patient Education method: Explanation, Demonstration, and Handouts Education comprehension: verbalized understanding and returned demonstration     HOME EXERCISE PROGRAM: Access Code: [email or web address] URL: [email or web address] Date: 07/30/2021 Prepared by: [name]  Exercises - Seated Sciatic Tensioner  - 1 x daily - 7 x weekly - 2 sets - 10 reps - Supine Sciatic Nerve Glide  - 1 x daily - 7 x weekly - 2 sets - 10 reps - Supine Lower Trunk Rotation  - 1 x daily - 7 x weekly - 2 sets - 10 reps - Supine Posterior Pelvic Tilt  - 1 x daily - 7 x weekly - 2 sets - 10 reps - 5 sec hold - Supine Piriformis Stretch with Leg Straight  - 1 x daily - 7 x weekly - 2 reps - 30 sec hold - Posterior Pelvic Tilt with Adduction Using Pillow in Hooklying  - 1 x daily - 7 x weekly - 2-3 sets - 10 reps - 5 sec hold - Supine 90/90 Abdominal Bracing  - 1 x daily - 7 x weekly - 2-3 reps - 15 sec hold - Hooklying Clamshell with Resistance  - 1 x daily - 7 x weekly - 2-3 sets - 15 reps - green theraband hold - Small Range Straight Leg Raise  - 1 x daily - 7 x weekly - 2-3 sets - 10 reps   ASSESSMENT:   CLINICAL IMPRESSION: Pt able to complete all prescribed exercises with no adverse effect. HEP updated for increased core and proximal hip strengthening at home. Will continue to progress exercises as able per POC.      OBJECTIVE IMPAIRMENTS decreased balance, decreased endurance, decreased mobility, difficulty walking, decreased ROM, decreased strength, improper body mechanics,  postural dysfunction, and pain.    ACTIVITY LIMITATIONS cleaning, community activity, laundry, and yard work.    PERSONAL FACTORS Time since onset of injury/illness/exacerbation and 1 comorbidity: HTN  are also affecting patient's functional outcome.     GOALS: Goals reviewed with patient? No   SHORT TERM GOALS:  Target date: 07/08/2021     Pt will be compliant and knowledgeable with initial HEP for improved comfort and carryover Baseline: initial HEP given  Goal status: MET   2.  Pt will self report lower back and right LE pain no greater than 6/10 for improved comfort and functional ability Baseline: 7/10 at worst Goal status: Ongoing   LONG TERM GOALS: Target date: 08/11/2021     Pt will improve FOTO function score to no less than predicted value as proxy for functional improvement Baseline:  Goal status: INITIAL   2.  Pt will self report lower back and right LE pain no greater than 2/10 for improved comfort and functional ability Baseline: 7/10 at worst Goal status: INITIAL   3.  Pt will improve R LE MMT to no less than 4+/5 for all tested motions for improved functional mobility Baseline: see chart Goal status: INITIAL   4.  Pt will increase 30 Second Sit to Stand rep count to no less than 14 reps for improved balance, strength, and functional mobility Baseline: 12 reps  Goal status: INITIAL   PLAN: PT FREQUENCY: 2x/week   PT DURATION: 8 weeks   PLANNED INTERVENTIONS: Therapeutic exercises, Therapeutic activity, Neuromuscular re-education, Balance training, Gait training, Patient/Family education, Joint mobilization, Aquatic Therapy, Dry Needling, Electrical stimulation, Cryotherapy, Moist heat, Manual therapy, and Re-evaluation.   PLAN FOR NEXT SESSION: assess HEP response, pilates-based exercise, progress neutral spine strengthening, Nustep next session    Ward Chatters, PT 07/31/2021, 8:18 AM

## 2021-08-04 ENCOUNTER — Ambulatory Visit: Payer: Medicare Other | Attending: Neurological Surgery

## 2021-08-04 DIAGNOSIS — M5459 Other low back pain: Secondary | ICD-10-CM | POA: Insufficient documentation

## 2021-08-04 DIAGNOSIS — M6281 Muscle weakness (generalized): Secondary | ICD-10-CM | POA: Diagnosis not present

## 2021-08-04 NOTE — Therapy (Signed)
OUTPATIENT PHYSICAL THERAPY TREATMENT NOTE   Patient Name: Kathryn Mann MRN: [id number] DOB:Jan 10, 1936, 86 y.o., female Today's Date: 08/04/2021  PCP: Ginger Organ., MD REFERRING PROVIDER: Dawley, Theodoro Doing, DO  END OF SESSION:   PT End of Session - 08/04/21 1553     Visit Number 8    Number of Visits 17    Date for PT Re-Evaluation 08/11/21    Authorization Type Medicare    PT Start Time 3536    PT Stop Time 1630    PT Time Calculation (min) 38 min    Activity Tolerance Patient tolerated treatment well;Patient limited by pain    Behavior During Therapy WFL for tasks assessed/performed              Past Medical History:  Diagnosis Date   GERD (gastroesophageal reflux disease)    Hypertension    Past Surgical History:  Procedure Laterality Date   ABDOMINAL HYSTERECTOMY     FOOT SURGERY     right foot surgery   There are no problems to display for this patient.   REFERRING DIAG: M48.061 (ICD-10-CM) - Spinal stenosis, lumbar region without neurogenic claudication  THERAPY DIAG:  Other low back pain  Muscle weakness (generalized)  Rationale for Evaluation and Treatment Rehabilitation  PERTINENT HISTORY: HTN  PRECAUTIONS: None  SUBJECTIVE: Patient reports she had 7/10 pain earlier today but that it is better now. She states that she often has pain on the R side of her back whenever she eats, advised to follow up with her doctor.   PAIN:  Are you having pain?  No: NPRS scale: 1/10 (7/10 at worst) Pain location: lower back; R LE  Pain description: sharp, tingling Aggravating factors: prolonged sitting,  Relieving factors: medication   OBJECTIVE: (objective measures completed at initial evaluation unless otherwise dated)  PATIENT SURVEYS:  FOTO   COGNITION:           Overall cognitive status: Within functional limits for tasks assessed                          SENSATION: WFL   POSTURE:  Small body habitus, increased thoracic kyphosis, fwd  head   PALPATION: TTP to R lumbar paraspinals   LE MMT:   MMT Right 06/16/2021 Left 06/16/2021  Hip flexion  3+/5 4/5  Hip extension      Hip abduction 3+/5 4/5  Hip adduction 3+/5 4/5  Hip external rotation      Hip internal rotation      Knee extension 4/5 4/5  Knee flexion 3+/5 4/5  Ankle dorsiflexion       Ankle plantarflexion      Ankle inversion      Ankle eversion      Grossly      (Blank rows = not tested)   LUMBAR SPECIAL TESTS:  Straight leg raise test: Negative and Slump test: Positive   FUNCTIONAL TESTS:  30 Second Sit to Stand: 12 reps   GAIT: Distance walked: 40f Assistive device utilized: None Level of assistance: Complete Independence Comments: decreased trunk extension, slowed gait speed       TODAY'S TREATMENT  [facility] Adult PT Treatment:                                                DATE:  08/04/2021 Therapeutic Exercise: Supine sciatic nerve glide x 20 Supine PPT with ball squeeze 2x10 - 5" hold  Supine PPT with hundreds 3x20  SKTC x30" BIL Supine PPT 90/90 hold 3x15" Supine clamshell 2x15 GTB  Supine SLR 2x15 each Step ups 2x10 8in each in // Lateral walk at // RTB x 2 laps Standing hip abd/ext 2x10 RTB around ankles   [facility] Adult PT Treatment:                                                DATE: 07/30/2021 Therapeutic Exercise: Supine sciatic nerve glide x 20 Supine PPT x 10 - 5" hold  Supine PPT with ball squeeze 2x10 - 5" hold  Supine PPT with hundreds 3x20  Supine PPT 90/90 hold 3x15" Supine clamshell 2x15 GTB  Supine SLR (small range) 2x15 each Step ups 2x10 8in each in // Lateral walk at // RTB x 2 laps Standing hip abd/ext 2x10 RTB  [facility] Adult PT Treatment:                                                DATE: 07/17/2021 Therapeutic Exercise: Nustep level 2 x 3 mins (pt req to terminate early due to pain in R LE) Bridges 2x10 Supine sciatic nerve glide x 20 Rt Supine PPT with ball squeeze 2x10 - 5" hold  Supine PPT with  pulldown 2x10 BTB Supine PPT 90/90 hold 2x20" Supine clamshell 2x15 BTB  Supine SLR (small range) 2x10 each   [facility] Adult PT Treatment:                                                DATE: 07/07/2021 Therapeutic Exercise: Supine sciatic nerve glide x 20 Supine PPT x 10 - 5" hold  Supine PPT with ball squeeze 2x10 - 5" hold  Supine PPT with pulldown 2x10 BTB Supine PPT with hundreds 3x20  Supine PPT 90/90 hold 3x10" Supine PPT 90/90 alt taps x 10  Supine clamshell 2x20 BTB  Supine SLR (small range) 2x15 each Step ups 2x10 8in each in // Lateral walk at // RTB x 2 laps   PATIENT EDUCATION:  Education details: eval findings, HEP, POC Person educated: Patient Education method: Explanation, Demonstration, and Handouts Education comprehension: verbalized understanding and returned demonstration     HOME EXERCISE PROGRAM: Access Code: [email or web address] URL: [email or web address] Date: 07/30/2021 Prepared by: [name]  Exercises - Seated Sciatic Tensioner  - 1 x daily - 7 x weekly - 2 sets - 10 reps - Supine Sciatic Nerve Glide  - 1 x daily - 7 x weekly - 2 sets - 10 reps - Supine Lower Trunk Rotation  - 1 x daily - 7 x weekly - 2 sets - 10 reps - Supine Posterior Pelvic Tilt  - 1 x daily - 7 x weekly - 2 sets - 10 reps - 5 sec hold - Supine Piriformis Stretch with Leg Straight  - 1 x daily - 7 x weekly - 2 reps - 30 sec hold - Posterior Pelvic Tilt with Adduction Using Pillow in Hooklying  - 1  x daily - 7 x weekly - 2-3 sets - 10 reps - 5 sec hold - Supine 90/90 Abdominal Bracing  - 1 x daily - 7 x weekly - 2-3 reps - 15 sec hold - Hooklying Clamshell with Resistance  - 1 x daily - 7 x weekly - 2-3 sets - 15 reps - green theraband hold - Small Range Straight Leg Raise  - 1 x daily - 7 x weekly - 2-3 sets - 10 reps   ASSESSMENT:   CLINICAL IMPRESSION: Patient presents to PT with mild pain in her lower back currently, but reports up to 7/10 earlier today. Session today  continued to focus on core and proximal hip strengthening. Patient was able to tolerate all prescribed exercises with no adverse effects. Patient continues to benefit from skilled PT services and should be progressed as able to improve functional independence.   OBJECTIVE IMPAIRMENTS decreased balance, decreased endurance, decreased mobility, difficulty walking, decreased ROM, decreased strength, improper body mechanics, postural dysfunction, and pain.    ACTIVITY LIMITATIONS cleaning, community activity, laundry, and yard work.    PERSONAL FACTORS Time since onset of injury/illness/exacerbation and 1 comorbidity: HTN  are also affecting patient's functional outcome.     GOALS: Goals reviewed with patient? No   SHORT TERM GOALS: Target date: 07/08/2021     Pt will be compliant and knowledgeable with initial HEP for improved comfort and carryover Baseline: initial HEP given  Goal status: MET   2.  Pt will self report lower back and right LE pain no greater than 6/10 for improved comfort and functional ability Baseline: 7/10 at worst Goal status: Ongoing   LONG TERM GOALS: Target date: 08/11/2021     Pt will improve FOTO function score to no less than predicted value as proxy for functional improvement Baseline:  Goal status: INITIAL   2.  Pt will self report lower back and right LE pain no greater than 2/10 for improved comfort and functional ability Baseline: 7/10 at worst Goal status: INITIAL   3.  Pt will improve R LE MMT to no less than 4+/5 for all tested motions for improved functional mobility Baseline: see chart Goal status: INITIAL   4.  Pt will increase 30 Second Sit to Stand rep count to no less than 14 reps for improved balance, strength, and functional mobility Baseline: 12 reps  Goal status: INITIAL   PLAN: PT FREQUENCY: 2x/week   PT DURATION: 8 weeks   PLANNED INTERVENTIONS: Therapeutic exercises, Therapeutic activity, Neuromuscular re-education, Balance  training, Gait training, Patient/Family education, Joint mobilization, Aquatic Therapy, Dry Needling, Electrical stimulation, Cryotherapy, Moist heat, Manual therapy, and Re-evaluation.   PLAN FOR NEXT SESSION: assess HEP response, pilates-based exercise, progress neutral spine strengthening, Nustep next session    Stephanie Benoit, PTA 08/04/2021, 3:55 PM    

## 2021-08-06 ENCOUNTER — Ambulatory Visit: Payer: Medicare Other

## 2021-08-06 DIAGNOSIS — M5459 Other low back pain: Secondary | ICD-10-CM

## 2021-08-06 DIAGNOSIS — M6281 Muscle weakness (generalized): Secondary | ICD-10-CM | POA: Diagnosis not present

## 2021-08-06 NOTE — Therapy (Signed)
OUTPATIENT PHYSICAL THERAPY TREATMENT NOTE   Patient Name: Kathryn Mann MRN: [id number] DOB:[birth year], 86 y.o., female Today's Date: 08/06/2021  PCP: Ginger Organ., MD REFERRING PROVIDER: Dawley, Theodoro Doing, DO  END OF SESSION:   PT End of Session - 08/06/21 1618     Visit Number 9    Number of Visits 17    Date for PT Re-Evaluation 08/11/21    Authorization Type Medicare    PT Start Time 1618    PT Stop Time 1656    PT Time Calculation (min) 38 min    Activity Tolerance Patient tolerated treatment well;Patient limited by pain    Behavior During Therapy WFL for tasks assessed/performed               Past Medical History:  Diagnosis Date   GERD (gastroesophageal reflux disease)    Hypertension    Past Surgical History:  Procedure Laterality Date   ABDOMINAL HYSTERECTOMY     FOOT SURGERY     right foot surgery   There are no problems to display for this patient.   REFERRING DIAG: M48.061 (ICD-10-CM) - Spinal stenosis, lumbar region without neurogenic claudication  THERAPY DIAG:  Other low back pain  Muscle weakness (generalized)  Rationale for Evaluation and Treatment Rehabilitation  PERTINENT HISTORY: HTN  PRECAUTIONS: None  SUBJECTIVE:  Pt presents to PT with no current reports of pain. Has been compliant with HEP with no adverse effect. She is ready to begin PT at this time.   PAIN:  Are you having pain?  No: NPRS scale: 0/10 (7/10 at worst) Pain location: lower back; R LE  Pain description: sharp, tingling Aggravating factors: prolonged sitting,  Relieving factors: medication   OBJECTIVE: (objective measures completed at initial evaluation unless otherwise dated)  PATIENT SURVEYS:  FOTO   COGNITION:           Overall cognitive status: Within functional limits for tasks assessed                          SENSATION: WFL   POSTURE:  Small body habitus, increased thoracic kyphosis, fwd head   PALPATION: TTP to R lumbar paraspinals    LE MMT:   MMT Right 06/16/2021 Left 06/16/2021  Hip flexion  3+/5 4/5  Hip extension      Hip abduction 3+/5 4/5  Hip adduction 3+/5 4/5  Hip external rotation      Hip internal rotation      Knee extension 4/5 4/5  Knee flexion 3+/5 4/5  Ankle dorsiflexion       Ankle plantarflexion      Ankle inversion      Ankle eversion      Grossly      (Blank rows = not tested)   LUMBAR SPECIAL TESTS:  Straight leg raise test: Negative and Slump test: Positive   FUNCTIONAL TESTS:  30 Second Sit to Stand: 12 reps   GAIT: Distance walked: 26ft Assistive device utilized: None Level of assistance: Complete Independence Comments: decreased trunk extension, slowed gait speed      TODAY'S TREATMENT  [facility] Adult PT Treatment:                                                DATE: 08/06/2021 Therapeutic Exercise: Step ups 2x10 6in // Lateral  walk RTB x 3 laps  Standing hip abd/ext 2x10 RTB Standing Black TB pulldown 2x10 STS 2x10 - no UE Supine PPT x 10 - 5" hold Supine PPT with ball squeeze 2x10 - 5" hold  Bridge with BTB 2x10 Supine PPT with hundreds 2x20" SKTC 2x30" BIL Supine clamshell 3x15 BTB  Supine SLR 2x15 each  [facility] Adult PT Treatment:                                                DATE: 08/04/2021 Therapeutic Exercise: Supine sciatic nerve glide x 20 Supine PPT with ball squeeze 2x10 - 5" hold  Supine PPT with hundreds 3x20  SKTC x30" BIL Supine PPT 90/90 hold 3x15" Supine clamshell 2x15 GTB  Supine SLR 2x15 each Step ups 2x10 8in each in // Lateral walk at // RTB x 2 laps Standing hip abd/ext 2x10 RTB around ankles   [facility] Adult PT Treatment:                                                DATE: 07/30/2021 Therapeutic Exercise: Supine sciatic nerve glide x 20 Supine PPT x 10 - 5" hold  Supine PPT with ball squeeze 2x10 - 5" hold  Supine PPT with hundreds 3x20  Supine PPT 90/90 hold 3x15" Supine clamshell 2x15 GTB  Supine SLR (small range) 2x15 each Step ups 2x10  8in each in // Lateral walk at // RTB x 2 laps Standing hip abd/ext 2x10 RTB  PATIENT EDUCATION:  Education details: eval findings, HEP, POC Person educated: Patient Education method: Explanation, Demonstration, and Handouts Education comprehension: verbalized understanding and returned demonstration     HOME EXERCISE PROGRAM: Access Code: [email or web address] URL: [email or web address] Date: 07/30/2021 Prepared by: [name]  Exercises - Seated Sciatic Tensioner  - 1 x daily - 7 x weekly - 2 sets - 10 reps - Supine Sciatic Nerve Glide  - 1 x daily - 7 x weekly - 2 sets - 10 reps - Supine Lower Trunk Rotation  - 1 x daily - 7 x weekly - 2 sets - 10 reps - Supine Posterior Pelvic Tilt  - 1 x daily - 7 x weekly - 2 sets - 10 reps - 5 sec hold - Supine Piriformis Stretch with Leg Straight  - 1 x daily - 7 x weekly - 2 reps - 30 sec hold - Posterior Pelvic Tilt with Adduction Using Pillow in Hooklying  - 1 x daily - 7 x weekly - 2-3 sets - 10 reps - 5 sec hold - Supine 90/90 Abdominal Bracing  - 1 x daily - 7 x weekly - 2-3 reps - 15 sec hold - Hooklying Clamshell with Resistance  - 1 x daily - 7 x weekly - 2-3 sets - 15 reps - green theraband hold - Small Range Straight Leg Raise  - 1 x daily - 7 x weekly - 2-3 sets - 10 reps   ASSESSMENT:   CLINICAL IMPRESSION: Pt was able to complete prescribed exercises with no adverse effect. Therapy continued to progress core and general strength in order to decrease pain and improve function. She continues to benefit from skilled PT and will continue to be seen and progressed as able per POC.  OBJECTIVE IMPAIRMENTS decreased balance, decreased endurance, decreased mobility, difficulty walking, decreased ROM, decreased strength, improper body mechanics, postural dysfunction, and pain.    ACTIVITY LIMITATIONS cleaning, community activity, laundry, and yard work.    PERSONAL FACTORS Time since onset of injury/illness/exacerbation and 1  comorbidity: HTN  are also affecting patient's functional outcome.     GOALS: Goals reviewed with patient? No   SHORT TERM GOALS: Target date: 07/08/2021     Pt will be compliant and knowledgeable with initial HEP for improved comfort and carryover Baseline: initial HEP given  Goal status: MET   2.  Pt will self report lower back and right LE pain no greater than 6/10 for improved comfort and functional ability Baseline: 7/10 at worst Goal status: Ongoing   LONG TERM GOALS: Target date: 08/11/2021     Pt will improve FOTO function score to no less than predicted value as proxy for functional improvement Baseline:  Goal status: INITIAL   2.  Pt will self report lower back and right LE pain no greater than 2/10 for improved comfort and functional ability Baseline: 7/10 at worst Goal status: INITIAL   3.  Pt will improve R LE MMT to no less than 4+/5 for all tested motions for improved functional mobility Baseline: see chart Goal status: INITIAL   4.  Pt will increase 30 Second Sit to Stand rep count to no less than 14 reps for improved balance, strength, and functional mobility Baseline: 12 reps  Goal status: INITIAL   PLAN: PT FREQUENCY: 2x/week   PT DURATION: 8 weeks   PLANNED INTERVENTIONS: Therapeutic exercises, Therapeutic activity, Neuromuscular re-education, Balance training, Gait training, Patient/Family education, Joint mobilization, Aquatic Therapy, Dry Needling, Electrical stimulation, Cryotherapy, Moist heat, Manual therapy, and Re-evaluation.   PLAN FOR NEXT SESSION: assess HEP response, pilates-based exercise, progress neutral spine strengthening, Nustep next session    Ward Chatters, PT 08/06/2021, 4:59 PM

## 2021-08-12 ENCOUNTER — Ambulatory Visit: Payer: Medicare Other

## 2021-08-12 DIAGNOSIS — M5459 Other low back pain: Secondary | ICD-10-CM

## 2021-08-12 DIAGNOSIS — M6281 Muscle weakness (generalized): Secondary | ICD-10-CM

## 2021-08-12 NOTE — Therapy (Signed)
OUTPATIENT PHYSICAL THERAPY TREATMENT NOTE/PROGRESS NOTE  Progress Note Reporting Period 06/16/2021 to 08/12/2021  See note below for Objective Data and Assessment of Progress/Goals.      Patient Name: THELDA GAGAN MRN: [id number] DOB:10-13-1935, 86 y.o., female Today's Date: 08/12/2021  PCP: Ginger Organ., MD REFERRING PROVIDER: Dawley, Theodoro Doing, DO  END OF SESSION:   PT End of Session - 08/12/21 1537     Visit Number 10    Number of Visits 17    Date for PT Re-Evaluation 09/25/21    Authorization Type Medicare    PT Start Time 1536    PT Stop Time 0867    PT Time Calculation (min) 39 min    Activity Tolerance Patient tolerated treatment well;Patient limited by pain    Behavior During Therapy WFL for tasks assessed/performed                Past Medical History:  Diagnosis Date   GERD (gastroesophageal reflux disease)    Hypertension    Past Surgical History:  Procedure Laterality Date   ABDOMINAL HYSTERECTOMY     FOOT SURGERY     right foot surgery   There are no problems to display for this patient.   REFERRING DIAG: M48.061 (ICD-10-CM) - Spinal stenosis, lumbar region without neurogenic claudication  THERAPY DIAG:  Other low back pain  Muscle weakness (generalized)  Rationale for Evaluation and Treatment Rehabilitation  PERTINENT HISTORY: HTN  PRECAUTIONS: None  SUBJECTIVE: Patient reports that her pain is better today than it was yesterday. She wants to keep coming to PT for a little while longer to make more improvements.    PAIN:  Are you having pain?  No: NPRS scale: 2/10 (9/10 at worst yesterday) Pain location: lower back; R LE  Pain description: sharp, tingling Aggravating factors: prolonged sitting,  Relieving factors: medication   OBJECTIVE: (objective measures completed at initial evaluation unless otherwise dated)  PATIENT SURVEYS:  FOTO   COGNITION:           Overall cognitive status: Within functional limits for tasks  assessed                          SENSATION: WFL   POSTURE:  Small body habitus, increased thoracic kyphosis, fwd head   PALPATION: TTP to R lumbar paraspinals   LE MMT:   MMT Right 06/16/2021 Left 06/16/2021 Right 08/12/21 Left 08/12/21  Hip flexion  3+/5 4/5 4/5 4/5  Hip extension        Hip abduction 3+/5 4/5 4/5 4/5  Hip adduction 3+/5 4/5 4/5 4/5  Hip external rotation        Hip internal rotation        Knee extension 4/5 4/5 4/5 4/5  Knee flexion 3+/5 4/5 3+/5 4/5  Ankle dorsiflexion         Ankle plantarflexion        Ankle inversion        Ankle eversion        Grossly        (Blank rows = not tested)   LUMBAR SPECIAL TESTS:  Straight leg raise test: Negative and Slump test: Positive   FUNCTIONAL TESTS:  30 Second Sit to Stand: 12 reps 08/12/2021: 12 reps   GAIT: Distance walked: 39f Assistive device utilized: None Level of assistance: Complete Independence Comments: decreased trunk extension, slowed gait speed      TODAY'S TREATMENT  [facility] Adult PT  Treatment:                                                DATE: 08/12/2021 Therapeutic Exercise: STS 2x10 - no UE Supine PPT with ball squeeze 2x10 - 5" hold  Supine PPT with hundreds 2x20" SKTC 2x30" BIL Seated hamstring curl GTB 2x10 BIL Supine clamshell 3x15 BTB  Bridge with BTB 2x10 Supine SLR 2x15 each   [facility] Adult PT Treatment:                                                DATE: 08/06/2021 Therapeutic Exercise: Step ups 2x10 6in // Lateral walk RTB x 3 laps  Standing hip abd/ext 2x10 RTB Standing Black TB pulldown 2x10 STS 2x10 - no UE Supine PPT x 10 - 5" hold Supine PPT with ball squeeze 2x10 - 5" hold  Bridge with BTB 2x10 Supine PPT with hundreds 2x20" SKTC 2x30" BIL Supine clamshell 3x15 BTB  Supine SLR 2x15 each  [facility] Adult PT Treatment:                                                DATE: 08/04/2021 Therapeutic Exercise: Supine sciatic nerve glide x 20 Supine PPT with ball squeeze  2x10 - 5" hold  Supine PPT with hundreds 3x20  SKTC x30" BIL Supine PPT 90/90 hold 3x15" Supine clamshell 2x15 GTB  Supine SLR 2x15 each Step ups 2x10 8in each in // Lateral walk at // RTB x 2 laps Standing hip abd/ext 2x10 RTB around ankles   PATIENT EDUCATION:  Education details: eval findings, HEP, POC Person educated: Patient Education method: Explanation, Demonstration, and Handouts Education comprehension: verbalized understanding and returned demonstration     HOME EXERCISE PROGRAM: Access Code: [email or web address] URL: [email or web address] Date: 07/30/2021 Prepared by: [name]  Exercises - Seated Sciatic Tensioner  - 1 x daily - 7 x weekly - 2 sets - 10 reps - Supine Sciatic Nerve Glide  - 1 x daily - 7 x weekly - 2 sets - 10 reps - Supine Lower Trunk Rotation  - 1 x daily - 7 x weekly - 2 sets - 10 reps - Supine Posterior Pelvic Tilt  - 1 x daily - 7 x weekly - 2 sets - 10 reps - 5 sec hold - Supine Piriformis Stretch with Leg Straight  - 1 x daily - 7 x weekly - 2 reps - 30 sec hold - Posterior Pelvic Tilt with Adduction Using Pillow in Hooklying  - 1 x daily - 7 x weekly - 2-3 sets - 10 reps - 5 sec hold - Supine 90/90 Abdominal Bracing  - 1 x daily - 7 x weekly - 2-3 reps - 15 sec hold - Hooklying Clamshell with Resistance  - 1 x daily - 7 x weekly - 2-3 sets - 15 reps - green theraband hold - Small Range Straight Leg Raise  - 1 x daily - 7 x weekly - 2-3 sets - 10 reps   ASSESSMENT:   CLINICAL IMPRESSION: Patient presents to PT with continued reports of low back pain  with radiating pain down the RLE. She reports her pain has overall gotten better since beginning therapy, but that it is still causing her problems and limiting function. She follows up with her neurologist on 08/18/21. She has not met her goals related to pain, STS, and muscle strength. She is interested in continuing with PT at this time due to not feeling like she has met her personal goals  with PT. She will benefit from continued PT to decrease overall pain and improve functional independence.   OBJECTIVE IMPAIRMENTS decreased balance, decreased endurance, decreased mobility, difficulty walking, decreased ROM, decreased strength, improper body mechanics, postural dysfunction, and pain.    ACTIVITY LIMITATIONS cleaning, community activity, laundry, and yard work.    PERSONAL FACTORS Time since onset of injury/illness/exacerbation and 1 comorbidity: HTN  are also affecting patient's functional outcome.     GOALS: Goals reviewed with patient? No   SHORT TERM GOALS: Target date: 07/08/2021     Pt will be compliant and knowledgeable with initial HEP for improved comfort and carryover Baseline: initial HEP given  Goal status: MET   2.  Pt will self report lower back and right LE pain no greater than 6/10 for improved comfort and functional ability Baseline: 7/10 at worst Goal status: Ongoing   LONG TERM GOALS: Target date: 09/25/2021     Pt will improve FOTO function score to no less than predicted value as proxy for functional improvement Baseline:  Goal status: INITIAL   2.  Pt will self report lower back and right LE pain no greater than 2/10 for improved comfort and functional ability Baseline: 7/10 at worst Goal status: Ongoing    3.  Pt will improve R LE MMT to no less than 4+/5 for all tested motions for improved functional mobility Baseline: see chart Goal status: Progressing; see updated chart 08/12/2021   4.  Pt will increase 30 Second Sit to Stand rep count to no less than 14 reps for improved balance, strength, and functional mobility Baseline: 12 reps  Goal status: Ongoing 12 reps 08/12/21   PLAN: PT FREQUENCY: 2x/week   PT DURATION: 8 weeks   PLANNED INTERVENTIONS: Therapeutic exercises, Therapeutic activity, Neuromuscular re-education, Balance training, Gait training, Patient/Family education, Joint mobilization, Aquatic Therapy, Dry Needling,  Electrical stimulation, Cryotherapy, Moist heat, Manual therapy, and Re-evaluation.   PLAN FOR NEXT SESSION: assess HEP response, pilates-based exercise, progress neutral spine strengthening, Nustep next session    Evelene Croon, PTA 08/12/2021, 3:37 PM

## 2021-08-14 ENCOUNTER — Ambulatory Visit: Payer: Medicare Other

## 2021-08-14 NOTE — Addendum Note (Signed)
Addended by: Ward Chatters on: 08/14/2021 03:01 PM   Modules accepted: Orders

## 2021-08-18 DIAGNOSIS — M48061 Spinal stenosis, lumbar region without neurogenic claudication: Secondary | ICD-10-CM | POA: Diagnosis not present

## 2021-08-19 ENCOUNTER — Ambulatory Visit: Payer: Medicare Other

## 2021-08-19 DIAGNOSIS — M6281 Muscle weakness (generalized): Secondary | ICD-10-CM | POA: Diagnosis not present

## 2021-08-19 DIAGNOSIS — M5459 Other low back pain: Secondary | ICD-10-CM | POA: Diagnosis not present

## 2021-08-19 NOTE — Therapy (Signed)
OUTPATIENT PHYSICAL THERAPY TREATMENT NOTE   Patient Name: Kathryn Mann MRN: [id number] DOB:1935/11/12, 86 y.o., female Today's Date: 08/20/2021  PCP: Ginger Organ., MD REFERRING PROVIDER: Dawley, Theodoro Doing, DO  END OF SESSION:   PT End of Session - 08/19/21 1619     Visit Number 11    Number of Visits 17    Date for PT Re-Evaluation 09/25/21    Authorization Type Medicare    PT Start Time 1619    PT Stop Time 5027    PT Time Calculation (min) 39 min    Activity Tolerance Patient tolerated treatment well;Patient limited by pain    Behavior During Therapy WFL for tasks assessed/performed                 Past Medical History:  Diagnosis Date   GERD (gastroesophageal reflux disease)    Hypertension    Past Surgical History:  Procedure Laterality Date   ABDOMINAL HYSTERECTOMY     FOOT SURGERY     right foot surgery   There are no problems to display for this patient.   REFERRING DIAG: M48.061 (ICD-10-CM) - Spinal stenosis, lumbar region without neurogenic claudication  THERAPY DIAG:  Other low back pain  Muscle weakness (generalized)  Rationale for Evaluation and Treatment Rehabilitation  PERTINENT HISTORY: HTN  PRECAUTIONS: None  SUBJECTIVE:  Pt presents to PT with reports of continued lower back pain. Is ready to begin PT at this time.    PAIN:  Are you having pain?  No: NPRS scale: 2/10 (9/10 at worst yesterday) Pain location: lower back; R LE  Pain description: sharp, tingling Aggravating factors: prolonged sitting,  Relieving factors: medication   OBJECTIVE: (objective measures completed at initial evaluation unless otherwise dated)  COGNITION:           Overall cognitive status: Within functional limits for tasks assessed                          SENSATION: WFL   POSTURE:  Small body habitus, increased thoracic kyphosis, fwd head   PALPATION: TTP to R lumbar paraspinals   LE MMT:   MMT Right 06/16/2021 Left 06/16/2021  Right 08/12/21 Left 08/12/21  Hip flexion  3+/5 4/5 4/5 4/5  Hip extension        Hip abduction 3+/5 4/5 4/5 4/5  Hip adduction 3+/5 4/5 4/5 4/5  Hip external rotation        Hip internal rotation        Knee extension 4/5 4/5 4/5 4/5  Knee flexion 3+/5 4/5 3+/5 4/5  Ankle dorsiflexion         Ankle plantarflexion        Ankle inversion        Ankle eversion        Grossly        (Blank rows = not tested)   LUMBAR SPECIAL TESTS:  Straight leg raise test: Negative and Slump test: Positive   FUNCTIONAL TESTS:  30 Second Sit to Stand: 12 reps 08/12/2021: 12 reps   GAIT: Distance walked: 7f Assistive device utilized: None Level of assistance: Complete Independence Comments: decreased trunk extension, slowed gait speed      TODAY'S TREATMENT  [facility] Adult PT Treatment:  DATE: 08/19/2021 Therapeutic Exercise: Bridge 2x15 Supine PPT x 10 - 5" hold Supine PPT with ball squeeze 2x15 - 5" hold Supine PPT with pulldown 2x10 Black TB Supine 90/90 alt taps 2x10 S/L clamshell x 15 GTB Supine clamshell 2x15 GTB Supine SLR 2x15 each Step up x 10 6in step Modalities:  MHP to R knee x 10 min post session  [facility] Adult PT Treatment:                                                DATE: 08/12/2021 Therapeutic Exercise: STS 2x10 - no UE Supine PPT with ball squeeze 2x10 - 5" hold  Supine PPT with hundreds 2x20" SKTC 2x30" BIL Seated hamstring curl GTB 2x10 BIL Supine clamshell 3x15 BTB  Bridge with BTB 2x10 Supine SLR 2x15 each   [facility] Adult PT Treatment:                                                DATE: 08/06/2021 Therapeutic Exercise: Step ups 2x10 6in // Lateral walk RTB x 3 laps  Standing hip abd/ext 2x10 RTB Standing Black TB pulldown 2x10 STS 2x10 - no UE Supine PPT x 10 - 5" hold Supine PPT with ball squeeze 2x10 - 5" hold  Bridge with BTB 2x10 Supine PPT with hundreds 2x20" SKTC 2x30" BIL Supine clamshell 3x15 BTB   Supine SLR 2x15 each  PATIENT EDUCATION:  Education details: continue HEP Person educated: Patient Education method: Explanation, Demonstration, and Handouts Education comprehension: verbalized understanding and returned demonstration     HOME EXERCISE PROGRAM: Access Code: [email or web address] URL: [email or web address] Date: 07/30/2021 Prepared by: [name]  Exercises - Seated Sciatic Tensioner  - 1 x daily - 7 x weekly - 2 sets - 10 reps - Supine Sciatic Nerve Glide  - 1 x daily - 7 x weekly - 2 sets - 10 reps - Supine Lower Trunk Rotation  - 1 x daily - 7 x weekly - 2 sets - 10 reps - Supine Posterior Pelvic Tilt  - 1 x daily - 7 x weekly - 2 sets - 10 reps - 5 sec hold - Supine Piriformis Stretch with Leg Straight  - 1 x daily - 7 x weekly - 2 reps - 30 sec hold - Posterior Pelvic Tilt with Adduction Using Pillow in Hooklying  - 1 x daily - 7 x weekly - 2-3 sets - 10 reps - 5 sec hold - Supine 90/90 Abdominal Bracing  - 1 x daily - 7 x weekly - 2-3 reps - 15 sec hold - Hooklying Clamshell with Resistance  - 1 x daily - 7 x weekly - 2-3 sets - 15 reps - green theraband hold - Small Range Straight Leg Raise  - 1 x daily - 7 x weekly - 2-3 sets - 10 reps   ASSESSMENT:   CLINICAL IMPRESSION: Pt was able to complete all prescribed exercises with no adverse effect or increase in pain. Therapy focused on improving core and proximal hip strength in order to decrease pain and improve functional ability. Will continue to progress as tolerated per POC.   OBJECTIVE IMPAIRMENTS decreased balance, decreased endurance, decreased mobility, difficulty walking, decreased ROM, decreased strength, improper body mechanics, postural dysfunction,  and pain.    ACTIVITY LIMITATIONS cleaning, community activity, laundry, and yard work.    PERSONAL FACTORS Time since onset of injury/illness/exacerbation and 1 comorbidity: HTN  are also affecting patient's functional outcome.     GOALS: Goals  reviewed with patient? No   SHORT TERM GOALS: Target date: 07/08/2021     Pt will be compliant and knowledgeable with initial HEP for improved comfort and carryover Baseline: initial HEP given  Goal status: MET   2.  Pt will self report lower back and right LE pain no greater than 6/10 for improved comfort and functional ability Baseline: 7/10 at worst Goal status: Ongoing   LONG TERM GOALS: Target date: 09/25/2021   Pt will improve FOTO function score to no less than predicted value as proxy for functional improvement Baseline:  Goal status: INITIAL   2.  Pt will self report lower back and right LE pain no greater than 2/10 for improved comfort and functional ability Baseline: 7/10 at worst Goal status: Ongoing    3.  Pt will improve R LE MMT to no less than 4+/5 for all tested motions for improved functional mobility Baseline: see chart Goal status: Progressing; see updated chart 08/12/2021   4.  Pt will increase 30 Second Sit to Stand rep count to no less than 14 reps for improved balance, strength, and functional mobility Baseline: 12 reps  Goal status: Ongoing 12 reps 08/12/21   PLAN: PT FREQUENCY: 2x/week   PT DURATION: 8 weeks   PLANNED INTERVENTIONS: Therapeutic exercises, Therapeutic activity, Neuromuscular re-education, Balance training, Gait training, Patient/Family education, Joint mobilization, Aquatic Therapy, Dry Needling, Electrical stimulation, Cryotherapy, Moist heat, Manual therapy, and Re-evaluation.   PLAN FOR NEXT SESSION: assess HEP response, pilates-based exercise, progress neutral spine strengthening, Nustep next session    Ward Chatters, PT 08/20/2021, 10:09 AM

## 2021-08-21 ENCOUNTER — Ambulatory Visit: Payer: Medicare Other

## 2021-08-21 DIAGNOSIS — M5459 Other low back pain: Secondary | ICD-10-CM

## 2021-08-21 DIAGNOSIS — M6281 Muscle weakness (generalized): Secondary | ICD-10-CM

## 2021-08-21 NOTE — Therapy (Signed)
OUTPATIENT PHYSICAL THERAPY TREATMENT NOTE   Patient Name: Kathryn Mann MRN: [id number] DOB:April 05, 1935, 86 y.o., female Today's Date: 08/21/2021  PCP: Ginger Organ., MD REFERRING PROVIDER: Dawley, Theodoro Doing, DO  END OF SESSION:   PT End of Session - 08/21/21 1617     Visit Number 12    Number of Visits 17    Date for PT Re-Evaluation 09/25/21    Authorization Type Medicare    PT Start Time 1618    PT Stop Time 0254    PT Time Calculation (min) 40 min    Activity Tolerance Patient tolerated treatment well;Patient limited by pain    Behavior During Therapy WFL for tasks assessed/performed                  Past Medical History:  Diagnosis Date   GERD (gastroesophageal reflux disease)    Hypertension    Past Surgical History:  Procedure Laterality Date   ABDOMINAL HYSTERECTOMY     FOOT SURGERY     right foot surgery   There are no problems to display for this patient.   REFERRING DIAG: M48.061 (ICD-10-CM) - Spinal stenosis, lumbar region without neurogenic claudication  THERAPY DIAG:  Other low back pain  Muscle weakness (generalized)  Rationale for Evaluation and Treatment Rehabilitation  PERTINENT HISTORY: HTN  PRECAUTIONS: None  SUBJECTIVE:  Pt presents to PT with continued lower back and R LE pain. Has been compliant with HEP with no adverse effect. Pt is ready to begin PT at this time.    PAIN:  Are you having pain?  No: NPRS scale: 2/10 (9/10 at worst yesterday) Pain location: lower back; R LE  Pain description: sharp, tingling Aggravating factors: prolonged sitting,  Relieving factors: medication   OBJECTIVE: (objective measures completed at initial evaluation unless otherwise dated)  COGNITION:           Overall cognitive status: Within functional limits for tasks assessed                          SENSATION: WFL   POSTURE:  Small body habitus, increased thoracic kyphosis, fwd head   PALPATION: TTP to R lumbar paraspinals    LE MMT:   MMT Right 06/16/2021 Left 06/16/2021 Right 08/12/21 Left 08/12/21  Hip flexion  3+/5 4/5 4/5 4/5  Hip extension        Hip abduction 3+/5 4/5 4/5 4/5  Hip adduction 3+/5 4/5 4/5 4/5  Hip external rotation        Hip internal rotation        Knee extension 4/5 4/5 4/5 4/5  Knee flexion 3+/5 4/5 3+/5 4/5  Ankle dorsiflexion         Ankle plantarflexion        Ankle inversion        Ankle eversion        Grossly        (Blank rows = not tested)   LUMBAR SPECIAL TESTS:  Straight leg raise test: Negative and Slump test: Positive   FUNCTIONAL TESTS:  30 Second Sit to Stand: 12 reps 08/12/2021: 12 reps   GAIT: Distance walked: 43f Assistive device utilized: None Level of assistance: Complete Independence Comments: decreased trunk extension, slowed gait speed      TODAY'S TREATMENT  [facility] Adult PT Treatment:  DATE: 08/21/2021 Therapeutic Exercise: NuStep lvl 6 UE/LE x 3 mine while taking subjective Bridge 2x15 Supine PPT x 10 - 5" hold Supine PPT with ball squeeze 2x15 - 5" hold Supine PPT with pulldown 2x10 Black TB Supine 90/90 alt taps 2x10 S/L clamshell x 15 GTB Supine clamshell 2x15 GTB Supine SLR 2x10 each 2.5# Step up x 10 8in step in //  Standing hip abd/ext 2x10 RTB Modalities:  MHP to R knee x 10 min post session  [facility] Adult PT Treatment:                                                DATE: 08/19/2021 Therapeutic Exercise: Bridge 2x15 Supine PPT x 10 - 5" hold Supine PPT with ball squeeze 2x15 - 5" hold Supine PPT with pulldown 2x10 Black TB Supine 90/90 alt taps 2x10 S/L clamshell x 15 GTB Supine clamshell 2x15 GTB Supine SLR 2x15 each Step up x 10 6in step Modalities:  MHP to R knee x 10 min post session  [facility] Adult PT Treatment:                                                DATE: 08/12/2021 Therapeutic Exercise: STS 2x10 - no UE Supine PPT with ball squeeze 2x10 - 5" hold  Supine PPT with  hundreds 2x20" SKTC 2x30" BIL Seated hamstring curl GTB 2x10 BIL Supine clamshell 3x15 BTB  Bridge with BTB 2x10 Supine SLR 2x15 each   PATIENT EDUCATION:  Education details: continue HEP Person educated: Patient Education method: Explanation, Demonstration, and Handouts Education comprehension: verbalized understanding and returned demonstration     HOME EXERCISE PROGRAM: Access Code: [email or web address] URL: [email or web address] Date: 07/30/2021 Prepared by: [name]  Exercises - Seated Sciatic Tensioner  - 1 x daily - 7 x weekly - 2 sets - 10 reps - Supine Sciatic Nerve Glide  - 1 x daily - 7 x weekly - 2 sets - 10 reps - Supine Lower Trunk Rotation  - 1 x daily - 7 x weekly - 2 sets - 10 reps - Supine Posterior Pelvic Tilt  - 1 x daily - 7 x weekly - 2 sets - 10 reps - 5 sec hold - Supine Piriformis Stretch with Leg Straight  - 1 x daily - 7 x weekly - 2 reps - 30 sec hold - Posterior Pelvic Tilt with Adduction Using Pillow in Hooklying  - 1 x daily - 7 x weekly - 2-3 sets - 10 reps - 5 sec hold - Supine 90/90 Abdominal Bracing  - 1 x daily - 7 x weekly - 2-3 reps - 15 sec hold - Hooklying Clamshell with Resistance  - 1 x daily - 7 x weekly - 2-3 sets - 15 reps - green theraband hold - Small Range Straight Leg Raise  - 1 x daily - 7 x weekly - 2-3 sets - 10 reps   ASSESSMENT:   CLINICAL IMPRESSION: Pt was able to complete all prescribed exercises with no adverse effect or increase in pain. Therapy focused on improving core and proximal hip strength in order to decrease pain and improve functional ability. Will continue to progress as tolerated per POC.   OBJECTIVE IMPAIRMENTS decreased balance, decreased  endurance, decreased mobility, difficulty walking, decreased ROM, decreased strength, improper body mechanics, postural dysfunction, and pain.    ACTIVITY LIMITATIONS cleaning, community activity, laundry, and yard work.    PERSONAL FACTORS Time since onset of  injury/illness/exacerbation and 1 comorbidity: HTN  are also affecting patient's functional outcome.     GOALS: Goals reviewed with patient? No   SHORT TERM GOALS: Target date: 07/08/2021     Pt will be compliant and knowledgeable with initial HEP for improved comfort and carryover Baseline: initial HEP given  Goal status: MET   2.  Pt will self report lower back and right LE pain no greater than 6/10 for improved comfort and functional ability Baseline: 7/10 at worst Goal status: Ongoing   LONG TERM GOALS: Target date: 09/25/2021   Pt will improve FOTO function score to no less than predicted value as proxy for functional improvement Baseline:  Goal status: INITIAL   2.  Pt will self report lower back and right LE pain no greater than 2/10 for improved comfort and functional ability Baseline: 7/10 at worst Goal status: Ongoing    3.  Pt will improve R LE MMT to no less than 4+/5 for all tested motions for improved functional mobility Baseline: see chart Goal status: Progressing; see updated chart 08/12/2021   4.  Pt will increase 30 Second Sit to Stand rep count to no less than 14 reps for improved balance, strength, and functional mobility Baseline: 12 reps  Goal status: Ongoing 12 reps 08/12/21   PLAN: PT FREQUENCY: 2x/week   PT DURATION: 8 weeks   PLANNED INTERVENTIONS: Therapeutic exercises, Therapeutic activity, Neuromuscular re-education, Balance training, Gait training, Patient/Family education, Joint mobilization, Aquatic Therapy, Dry Needling, Electrical stimulation, Cryotherapy, Moist heat, Manual therapy, and Re-evaluation.   PLAN FOR NEXT SESSION: assess HEP response, pilates-based exercise, progress neutral spine strengthening, Nustep next session    Ward Chatters, PT 08/21/2021, 5:01 PM

## 2021-08-26 ENCOUNTER — Ambulatory Visit: Payer: Medicare Other

## 2021-08-26 DIAGNOSIS — M6281 Muscle weakness (generalized): Secondary | ICD-10-CM | POA: Diagnosis not present

## 2021-08-26 DIAGNOSIS — M5459 Other low back pain: Secondary | ICD-10-CM | POA: Diagnosis not present

## 2021-08-26 NOTE — Therapy (Signed)
OUTPATIENT PHYSICAL THERAPY TREATMENT NOTE   Patient Name: Kathryn Mann MRN: [id number] DOB:1935-04-14, 86 y.o., female Today's Date: 08/26/2021  PCP: Ginger Organ., MD REFERRING PROVIDER: Dawley, Theodoro Doing, DO  END OF SESSION:   PT End of Session - 08/26/21 1532     Visit Number 13    Number of Visits 17    Date for PT Re-Evaluation 09/25/21    Authorization Type Medicare    PT Start Time 1443    PT Stop Time 1615    PT Time Calculation (min) 45 min                   Past Medical History:  Diagnosis Date   GERD (gastroesophageal reflux disease)    Hypertension    Past Surgical History:  Procedure Laterality Date   ABDOMINAL HYSTERECTOMY     FOOT SURGERY     right foot surgery   There are no problems to display for this patient.   REFERRING DIAG: M48.061 (ICD-10-CM) - Spinal stenosis, lumbar region without neurogenic claudication  THERAPY DIAG:  Other low back pain  Muscle weakness (generalized)  Rationale for Evaluation and Treatment Rehabilitation  PERTINENT HISTORY: HTN  PRECAUTIONS: None  SUBJECTIVE: Pt presents to PT with continued lower back and R LE pain. She reports her pain was bad on Sunday with the weather.   PAIN:  Are you having pain?  No: NPRS scale: 4/10 (9/10 at worst Sunday) Pain location: lower back; R LE  Pain description: sharp, tingling Aggravating factors: prolonged sitting,  Relieving factors: medication   OBJECTIVE: (objective measures completed at initial evaluation unless otherwise dated)  COGNITION:           Overall cognitive status: Within functional limits for tasks assessed                          SENSATION: WFL   POSTURE:  Small body habitus, increased thoracic kyphosis, fwd head   PALPATION: TTP to R lumbar paraspinals   LE MMT:   MMT Right 06/16/2021 Left 06/16/2021 Right 08/12/21 Left 08/12/21  Hip flexion  3+/5 4/5 4/5 4/5  Hip extension        Hip abduction 3+/5 4/5 4/5 4/5  Hip  adduction 3+/5 4/5 4/5 4/5  Hip external rotation        Hip internal rotation        Knee extension 4/5 4/5 4/5 4/5  Knee flexion 3+/5 4/5 3+/5 4/5  Ankle dorsiflexion         Ankle plantarflexion        Ankle inversion        Ankle eversion        Grossly        (Blank rows = not tested)   LUMBAR SPECIAL TESTS:  Straight leg raise test: Negative and Slump test: Positive   FUNCTIONAL TESTS:  30 Second Sit to Stand: 12 reps 08/12/2021: 12 reps   GAIT: Distance walked: 63f Assistive device utilized: None Level of assistance: Complete Independence Comments: decreased trunk extension, slowed gait speed      TODAY'S TREATMENT  [facility] Adult PT Treatment:                                                DATE: 08/26/2021 Therapeutic Exercise: NuStep lvl  5 UE/LE x 3 mins while taking subjective Bridge 2x15 Supine PPT with ball squeeze 2x15 - 5" hold Supine PPT with pulldown 2x10 Black TB Supine 90/90 alt taps 2x10 S/L clamshell x 15 GTB Supine clamshell 2x10 GTB Step up x 10 8in step in //  Standing hip abd/ext 3x10 RTB Modalities:  MHP to R knee x 10 min post session, pt in supine with RLE on bolster  [facility] Adult PT Treatment:                                                DATE: 08/21/2021 Therapeutic Exercise: NuStep lvl 6 UE/LE x 3 mine while taking subjective Bridge 2x15 Supine PPT x 10 - 5" hold Supine PPT with ball squeeze 2x15 - 5" hold Supine PPT with pulldown 2x10 Black TB Supine 90/90 alt taps 2x10 S/L clamshell x 15 GTB Supine clamshell 2x15 GTB Supine SLR 2x10 each 2.5# Step up x 10 8in step in //  Standing hip abd/ext 2x10 RTB Modalities:  MHP to R knee x 10 min post session  [facility] Adult PT Treatment:                                                DATE: 08/19/2021 Therapeutic Exercise: Bridge 2x15 Supine PPT x 10 - 5" hold Supine PPT with ball squeeze 2x15 - 5" hold Supine PPT with pulldown 2x10 Black TB Supine 90/90 alt taps 2x10 S/L clamshell x 15  GTB Supine clamshell 2x15 GTB Supine SLR 2x15 each Step up x 10 6in step Modalities:  MHP to R knee x 10 min post session   PATIENT EDUCATION:  Education details: continue HEP Person educated: Patient Education method: Explanation, Demonstration, and Handouts Education comprehension: verbalized understanding and returned demonstration     HOME EXERCISE PROGRAM: Access Code: [email or web address] URL: [email or web address] Date: 07/30/2021 Prepared by: [name]  Exercises - Seated Sciatic Tensioner  - 1 x daily - 7 x weekly - 2 sets - 10 reps - Supine Sciatic Nerve Glide  - 1 x daily - 7 x weekly - 2 sets - 10 reps - Supine Lower Trunk Rotation  - 1 x daily - 7 x weekly - 2 sets - 10 reps - Supine Posterior Pelvic Tilt  - 1 x daily - 7 x weekly - 2 sets - 10 reps - 5 sec hold - Supine Piriformis Stretch with Leg Straight  - 1 x daily - 7 x weekly - 2 reps - 30 sec hold - Posterior Pelvic Tilt with Adduction Using Pillow in Hooklying  - 1 x daily - 7 x weekly - 2-3 sets - 10 reps - 5 sec hold - Supine 90/90 Abdominal Bracing  - 1 x daily - 7 x weekly - 2-3 reps - 15 sec hold - Hooklying Clamshell with Resistance  - 1 x daily - 7 x weekly - 2-3 sets - 15 reps - green theraband hold - Small Range Straight Leg Raise  - 1 x daily - 7 x weekly - 2-3 sets - 10 reps   ASSESSMENT:   CLINICAL IMPRESSION: Patient presents to PT with continued pain and radiating LE symptoms on the R side. Session today continued to focus on core and  proximal hip strengthening to decrease overall pain. Patient was able to tolerate all prescribed exercises with no adverse effects. Patient continues to benefit from skilled PT services and should be progressed as able to improve functional independence.   OBJECTIVE IMPAIRMENTS decreased balance, decreased endurance, decreased mobility, difficulty walking, decreased ROM, decreased strength, improper body mechanics, postural dysfunction, and pain.     ACTIVITY LIMITATIONS cleaning, community activity, laundry, and yard work.    PERSONAL FACTORS Time since onset of injury/illness/exacerbation and 1 comorbidity: HTN  are also affecting patient's functional outcome.     GOALS: Goals reviewed with patient? No   SHORT TERM GOALS: Target date: 07/08/2021     Pt will be compliant and knowledgeable with initial HEP for improved comfort and carryover Baseline: initial HEP given  Goal status: MET   2.  Pt will self report lower back and right LE pain no greater than 6/10 for improved comfort and functional ability Baseline: 7/10 at worst Goal status: Ongoing   LONG TERM GOALS: Target date: 09/25/2021   Pt will improve FOTO function score to no less than predicted value as proxy for functional improvement Baseline:  Goal status: INITIAL   2.  Pt will self report lower back and right LE pain no greater than 2/10 for improved comfort and functional ability Baseline: 7/10 at worst Goal status: Ongoing    3.  Pt will improve R LE MMT to no less than 4+/5 for all tested motions for improved functional mobility Baseline: see chart Goal status: Progressing; see updated chart 08/12/2021   4.  Pt will increase 30 Second Sit to Stand rep count to no less than 14 reps for improved balance, strength, and functional mobility Baseline: 12 reps  Goal status: Ongoing 12 reps 08/12/21   PLAN: PT FREQUENCY: 2x/week   PT DURATION: 8 weeks   PLANNED INTERVENTIONS: Therapeutic exercises, Therapeutic activity, Neuromuscular re-education, Balance training, Gait training, Patient/Family education, Joint mobilization, Aquatic Therapy, Dry Needling, Electrical stimulation, Cryotherapy, Moist heat, Manual therapy, and Re-evaluation.   PLAN FOR NEXT SESSION: assess HEP response, pilates-based exercise, progress neutral spine strengthening, Nustep next session    Evelene Croon, PTA 08/26/2021, 3:33 PM

## 2021-08-28 ENCOUNTER — Ambulatory Visit: Payer: Medicare Other

## 2021-08-28 DIAGNOSIS — M6281 Muscle weakness (generalized): Secondary | ICD-10-CM

## 2021-08-28 DIAGNOSIS — M5459 Other low back pain: Secondary | ICD-10-CM

## 2021-08-28 NOTE — Therapy (Signed)
OUTPATIENT PHYSICAL THERAPY TREATMENT NOTE   Patient Name: Kathryn Mann MRN: [id number] DOB:12/12/1935, 86 y.o., female Today's Date: 08/28/2021  PCP: Ginger Organ., MD REFERRING PROVIDER: Dawley, Theodoro Doing, DO  END OF SESSION:   PT End of Session - 08/28/21 1621     Visit Number 14    Number of Visits 17    Date for PT Re-Evaluation 09/25/21    Authorization Type Medicare    PT Start Time 1194    PT Stop Time 1700    PT Time Calculation (min) 39 min    Activity Tolerance Patient tolerated treatment well;Patient limited by pain    Behavior During Therapy WFL for tasks assessed/performed             Past Medical History:  Diagnosis Date   GERD (gastroesophageal reflux disease)    Hypertension    Past Surgical History:  Procedure Laterality Date   ABDOMINAL HYSTERECTOMY     FOOT SURGERY     right foot surgery   There are no problems to display for this patient.   REFERRING DIAG: M48.061 (ICD-10-CM) - Spinal stenosis, lumbar region without neurogenic claudication  THERAPY DIAG:  Other low back pain  Muscle weakness (generalized)  Rationale for Evaluation and Treatment Rehabilitation  PERTINENT HISTORY: HTN  PRECAUTIONS: None  SUBJECTIVE: Patient reports that she was in a lot of pain this morning, but that it has gotten better throughout the day.   PAIN:  Are you having pain?  No: NPRS scale: 4/10 (9/10 at worst) Pain location: lower back; R LE  Pain description: sharp, tingling Aggravating factors: prolonged sitting,  Relieving factors: medication   OBJECTIVE: (objective measures completed at initial evaluation unless otherwise dated)  COGNITION:           Overall cognitive status: Within functional limits for tasks assessed                          SENSATION: WFL   POSTURE:  Small body habitus, increased thoracic kyphosis, fwd head   PALPATION: TTP to R lumbar paraspinals   LE MMT:   MMT Right 06/16/2021 Left 06/16/2021  Right 08/12/21 Left 08/12/21  Hip flexion  3+/5 4/5 4/5 4/5  Hip extension        Hip abduction 3+/5 4/5 4/5 4/5  Hip adduction 3+/5 4/5 4/5 4/5  Hip external rotation        Hip internal rotation        Knee extension 4/5 4/5 4/5 4/5  Knee flexion 3+/5 4/5 3+/5 4/5  Ankle dorsiflexion         Ankle plantarflexion        Ankle inversion        Ankle eversion        Grossly        (Blank rows = not tested)   LUMBAR SPECIAL TESTS:  Straight leg raise test: Negative and Slump test: Positive   FUNCTIONAL TESTS:  30 Second Sit to Stand: 12 reps 08/12/2021: 12 reps   GAIT: Distance walked: 54f Assistive device utilized: None Level of assistance: Complete Independence Comments: decreased trunk extension, slowed gait speed      TODAY'S TREATMENT  [facility] Adult PT Treatment:  DATE: 08/28/2021 Therapeutic Exercise: NuStep lvl 5 UE/LE x 3 mins while taking subjective Bridge 2x15 Supine PPT with ball squeeze 2x15 - 5" hold Supine PPT with pulldown 2x10 Black TB Supine 90/90 alt taps 2x10 S/L clamshell x 15 GTB Supine marching against GTB 2x10 BIL Step up x 10 8in step in // forward Standing hip abd/ext 2x10 RTB Modalities:  MHP to R knee x 10 min post session, pt in supine with RLE on bolster  [facility] Adult PT Treatment:                                                DATE: 08/26/2021 Therapeutic Exercise: NuStep lvl 5 UE/LE x 3 mins while taking subjective Bridge 2x15 Supine PPT with ball squeeze 2x15 - 5" hold Supine PPT with pulldown 2x10 Black TB Supine 90/90 alt taps 2x10 S/L clamshell x 15 GTB Supine clamshell 2x10 GTB Step up x 10 8in step in //  Standing hip abd/ext 3x10 RTB Modalities:  MHP to R knee x 10 min post session, pt in supine with RLE on bolster  [facility] Adult PT Treatment:                                                DATE: 08/21/2021 Therapeutic Exercise: NuStep lvl 6 UE/LE x 3 mine while taking subjective Bridge  2x15 Supine PPT x 10 - 5" hold Supine PPT with ball squeeze 2x15 - 5" hold Supine PPT with pulldown 2x10 Black TB Supine 90/90 alt taps 2x10 S/L clamshell x 15 GTB Supine clamshell 2x15 GTB Supine SLR 2x10 each 2.5# Step up x 10 8in step in //  Standing hip abd/ext 2x10 RTB Modalities:  MHP to R knee x 10 min post session   PATIENT EDUCATION:  Education details: continue HEP Person educated: Patient Education method: Explanation, Demonstration, and Handouts Education comprehension: verbalized understanding and returned demonstration     HOME EXERCISE PROGRAM: Access Code: [email or web address] URL: [email or web address] Date: 07/30/2021 Prepared by: [name]  Exercises - Seated Sciatic Tensioner  - 1 x daily - 7 x weekly - 2 sets - 10 reps - Supine Sciatic Nerve Glide  - 1 x daily - 7 x weekly - 2 sets - 10 reps - Supine Lower Trunk Rotation  - 1 x daily - 7 x weekly - 2 sets - 10 reps - Supine Posterior Pelvic Tilt  - 1 x daily - 7 x weekly - 2 sets - 10 reps - 5 sec hold - Supine Piriformis Stretch with Leg Straight  - 1 x daily - 7 x weekly - 2 reps - 30 sec hold - Posterior Pelvic Tilt with Adduction Using Pillow in Hooklying  - 1 x daily - 7 x weekly - 2-3 sets - 10 reps - 5 sec hold - Supine 90/90 Abdominal Bracing  - 1 x daily - 7 x weekly - 2-3 reps - 15 sec hold - Hooklying Clamshell with Resistance  - 1 x daily - 7 x weekly - 2-3 sets - 15 reps - green theraband hold - Small Range Straight Leg Raise  - 1 x daily - 7 x weekly - 2-3 sets - 10 reps   ASSESSMENT:   CLINICAL IMPRESSION: Patient  presents to PT with continued reports of pain and radiating RLE symptoms, though lessened this session. Session today continued to focus on core and proximal hip strengthening. Patient was able to tolerate all prescribed exercises with no adverse effects. Patient continues to benefit from skilled PT services and should be progressed as able to improve functional independence.    OBJECTIVE IMPAIRMENTS decreased balance, decreased endurance, decreased mobility, difficulty walking, decreased ROM, decreased strength, improper body mechanics, postural dysfunction, and pain.    ACTIVITY LIMITATIONS cleaning, community activity, laundry, and yard work.    PERSONAL FACTORS Time since onset of injury/illness/exacerbation and 1 comorbidity: HTN  are also affecting patient's functional outcome.     GOALS: Goals reviewed with patient? No   SHORT TERM GOALS: Target date: 07/08/2021     Pt will be compliant and knowledgeable with initial HEP for improved comfort and carryover Baseline: initial HEP given  Goal status: MET   2.  Pt will self report lower back and right LE pain no greater than 6/10 for improved comfort and functional ability Baseline: 7/10 at worst Goal status: Ongoing   LONG TERM GOALS: Target date: 09/25/2021   Pt will improve FOTO function score to no less than predicted value as proxy for functional improvement Baseline:  Goal status: INITIAL   2.  Pt will self report lower back and right LE pain no greater than 2/10 for improved comfort and functional ability Baseline: 7/10 at worst Goal status: Ongoing    3.  Pt will improve R LE MMT to no less than 4+/5 for all tested motions for improved functional mobility Baseline: see chart Goal status: Progressing; see updated chart 08/12/2021   4.  Pt will increase 30 Second Sit to Stand rep count to no less than 14 reps for improved balance, strength, and functional mobility Baseline: 12 reps  Goal status: Ongoing 12 reps 08/12/21   PLAN: PT FREQUENCY: 2x/week   PT DURATION: 8 weeks   PLANNED INTERVENTIONS: Therapeutic exercises, Therapeutic activity, Neuromuscular re-education, Balance training, Gait training, Patient/Family education, Joint mobilization, Aquatic Therapy, Dry Needling, Electrical stimulation, Cryotherapy, Moist heat, Manual therapy, and Re-evaluation.   PLAN FOR NEXT SESSION: assess  HEP response, pilates-based exercise, progress neutral spine strengthening, Nustep next session    Evelene Croon, PTA 08/28/2021, 5:01 PM

## 2021-09-01 NOTE — Therapy (Signed)
OUTPATIENT PHYSICAL THERAPY TREATMENT NOTE   Patient Name: Kathryn Mann MRN: [id number] DOB:1935-11-03, 86 y.o., female Today's Date: 09/02/2021  PCP: Ginger Organ., MD REFERRING PROVIDER: Dawley, Theodoro Doing, DO  END OF SESSION:   PT End of Session - 09/02/21 1618     Visit Number 15    Number of Visits 17    Date for PT Re-Evaluation 09/25/21    Authorization Type Medicare    PT Start Time 6599    PT Stop Time 1655    PT Time Calculation (min) 38 min    Activity Tolerance Patient tolerated treatment well;Patient limited by pain    Behavior During Therapy WFL for tasks assessed/performed              Past Medical History:  Diagnosis Date   GERD (gastroesophageal reflux disease)    Hypertension    Past Surgical History:  Procedure Laterality Date   ABDOMINAL HYSTERECTOMY     FOOT SURGERY     right foot surgery   There are no problems to display for this patient.   REFERRING DIAG: M48.061 (ICD-10-CM) - Spinal stenosis, lumbar region without neurogenic claudication  THERAPY DIAG:  Other low back pain  Muscle weakness (generalized)  Rationale for Evaluation and Treatment Rehabilitation  PERTINENT HISTORY: HTN  PRECAUTIONS: None  SUBJECTIVE: Patient reports the pain in her legs was really bothering her this morning. She reports her pain is effected by the barometric pressure, "especially when it comes from [place]."    PAIN:  Are you having pain?  No: NPRS scale: 9/10 Pain location: Bilateral LE (Rt>Lt)  Pain description: sharp, tingling Aggravating factors: prolonged sitting,  Relieving factors: medication   OBJECTIVE: (objective measures completed at initial evaluation unless otherwise dated)  COGNITION:           Overall cognitive status: Within functional limits for tasks assessed                          SENSATION: WFL   POSTURE:  Small body habitus, increased thoracic kyphosis, fwd head   PALPATION: TTP to R lumbar paraspinals   LE  MMT:   MMT Right 06/16/2021 Left 06/16/2021 Right 08/12/21 Left 08/12/21  Hip flexion  3+/5 4/5 4/5 4/5  Hip extension        Hip abduction 3+/5 4/5 4/5 4/5  Hip adduction 3+/5 4/5 4/5 4/5  Hip external rotation        Hip internal rotation        Knee extension 4/5 4/5 4/5 4/5  Knee flexion 3+/5 4/5 3+/5 4/5  Ankle dorsiflexion         Ankle plantarflexion        Ankle inversion        Ankle eversion        Grossly        (Blank rows = not tested)   LUMBAR SPECIAL TESTS:  Straight leg raise test: Negative and Slump test: Positive   FUNCTIONAL TESTS:  30 Second Sit to Stand: 12 reps 08/12/2021: 12 reps   GAIT: Distance walked: 38f Assistive device utilized: None Level of assistance: Complete Independence Comments: decreased trunk extension, slowed gait speed      TODAY'S TREATMENT  [facility] Adult PT Treatment:  DATE: 09/02/21 Therapeutic Exercise: NuStep level 5 x 4 minutes  Single knee to chest x 10, 5 sec hold  Double knee to chest 2 x 10  Seated pelvic tilts 1 x 10  Seated stability ball rollout x 1 minute  Seated rows green band 2 x 10  Seated march 2 x 10   [facility] Adult PT Treatment:                                                DATE: 08/28/2021 Therapeutic Exercise: NuStep lvl 5 UE/LE x 3 mins while taking subjective Bridge 2x15 Supine PPT with ball squeeze 2x15 - 5" hold Supine PPT with pulldown 2x10 Black TB Supine 90/90 alt taps 2x10 S/L clamshell x 15 GTB Supine marching against GTB 2x10 BIL Step up x 10 8in step in // forward Standing hip abd/ext 2x10 RTB Modalities:  MHP to R knee x 10 min post session, pt in supine with RLE on bolster  [facility] Adult PT Treatment:                                                DATE: 08/26/2021 Therapeutic Exercise: NuStep lvl 5 UE/LE x 3 mins while taking subjective Bridge 2x15 Supine PPT with ball squeeze 2x15 - 5" hold Supine PPT with pulldown 2x10 Black TB Supine 90/90 alt  taps 2x10 S/L clamshell x 15 GTB Supine clamshell 2x10 GTB Step up x 10 8in step in //  Standing hip abd/ext 3x10 RTB Modalities:  MHP to R knee x 10 min post session, pt in supine with RLE on bolster  [facility] Adult PT Treatment:                                                DATE: 08/21/2021 Therapeutic Exercise: NuStep lvl 6 UE/LE x 3 mine while taking subjective Bridge 2x15 Supine PPT x 10 - 5" hold Supine PPT with ball squeeze 2x15 - 5" hold Supine PPT with pulldown 2x10 Black TB Supine 90/90 alt taps 2x10 S/L clamshell x 15 GTB Supine clamshell 2x15 GTB Supine SLR 2x10 each 2.5# Step up x 10 8in step in //  Standing hip abd/ext 2x10 RTB Modalities:  MHP to R knee x 10 min post session   PATIENT EDUCATION:  Education details: HEP, anatomy of current condition  Person educated: Patient Education method: Explanation, Demonstration, and Handouts Education comprehension: verbalized understanding and returned demonstration     HOME EXERCISE PROGRAM: Access Code: [email or web address] URL: [email or web address] Date: 07/30/2021 Prepared by: [name]  Exercises - Seated Sciatic Tensioner  - 1 x daily - 7 x weekly - 2 sets - 10 reps - Supine Sciatic Nerve Glide  - 1 x daily - 7 x weekly - 2 sets - 10 reps - Supine Lower Trunk Rotation  - 1 x daily - 7 x weekly - 2 sets - 10 reps - Supine Posterior Pelvic Tilt  - 1 x daily - 7 x weekly - 2 sets - 10 reps - 5 sec hold - Supine Piriformis Stretch with Leg Straight  - 1 x daily -  7 x weekly - 2 reps - 30 sec hold - Posterior Pelvic Tilt with Adduction Using Pillow in Hooklying  - 1 x daily - 7 x weekly - 2-3 sets - 10 reps - 5 sec hold - Supine 90/90 Abdominal Bracing  - 1 x daily - 7 x weekly - 2-3 reps - 15 sec hold - Hooklying Clamshell with Resistance  - 1 x daily - 7 x weekly - 2-3 sets - 15 reps - green theraband hold - Small Range Straight Leg Raise  - 1 x daily - 7 x weekly - 2-3 sets - 10 reps   ASSESSMENT:    CLINICAL IMPRESSION: Patient exhibits positive response to repeated flexion in supine reporting a reduction in her RLE pain when initially performed. Session focused on progression of core/postural strengthening with fair tolerance as she continues to report RLE pain throughout session.   OBJECTIVE IMPAIRMENTS decreased balance, decreased endurance, decreased mobility, difficulty walking, decreased ROM, decreased strength, improper body mechanics, postural dysfunction, and pain.    ACTIVITY LIMITATIONS cleaning, community activity, laundry, and yard work.    PERSONAL FACTORS Time since onset of injury/illness/exacerbation and 1 comorbidity: HTN  are also affecting patient's functional outcome.     GOALS: Goals reviewed with patient? No   SHORT TERM GOALS: Target date: 07/08/2021     Pt will be compliant and knowledgeable with initial HEP for improved comfort and carryover Baseline: initial HEP given  Goal status: MET   2.  Pt will self report lower back and right LE pain no greater than 6/10 for improved comfort and functional ability Baseline: 7/10 at worst Goal status: Ongoing   LONG TERM GOALS: Target date: 09/25/2021   Pt will improve FOTO function score to no less than predicted value as proxy for functional improvement Baseline:  Goal status: INITIAL   2.  Pt will self report lower back and right LE pain no greater than 2/10 for improved comfort and functional ability Baseline: 7/10 at worst Goal status: Ongoing    3.  Pt will improve R LE MMT to no less than 4+/5 for all tested motions for improved functional mobility Baseline: see chart Goal status: Progressing; see updated chart 08/12/2021   4.  Pt will increase 30 Second Sit to Stand rep count to no less than 14 reps for improved balance, strength, and functional mobility Baseline: 12 reps  Goal status: Ongoing 12 reps 08/12/21   PLAN: PT FREQUENCY: 2x/week   PT DURATION: 8 weeks   PLANNED INTERVENTIONS:  Therapeutic exercises, Therapeutic activity, Neuromuscular re-education, Balance training, Gait training, Patient/Family education, Joint mobilization, Aquatic Therapy, Dry Needling, Electrical stimulation, Cryotherapy, Moist heat, Manual therapy, and Re-evaluation.   PLAN FOR NEXT SESSION: assess HEP response, assess response to flexion biased movement, core/hip strengthening, postural strengthening    Gwendolyn Grant, PT, DPT, ATC 09/02/21 4:57 PM

## 2021-09-02 ENCOUNTER — Ambulatory Visit: Payer: Medicare Other | Attending: Neurological Surgery

## 2021-09-02 DIAGNOSIS — M5459 Other low back pain: Secondary | ICD-10-CM

## 2021-09-02 DIAGNOSIS — M6281 Muscle weakness (generalized): Secondary | ICD-10-CM | POA: Diagnosis not present

## 2021-09-04 ENCOUNTER — Other Ambulatory Visit: Payer: Self-pay

## 2021-09-04 ENCOUNTER — Encounter: Payer: Self-pay | Admitting: Physical Therapy

## 2021-09-04 ENCOUNTER — Ambulatory Visit: Payer: Medicare Other | Admitting: Physical Therapy

## 2021-09-04 DIAGNOSIS — M6281 Muscle weakness (generalized): Secondary | ICD-10-CM

## 2021-09-04 DIAGNOSIS — M5459 Other low back pain: Secondary | ICD-10-CM | POA: Diagnosis not present
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
## 2021-09-08 ENCOUNTER — Other Ambulatory Visit: Payer: Self-pay

## 2021-09-08 ENCOUNTER — Encounter: Payer: Self-pay | Admitting: Physical Therapy

## 2021-09-08 ENCOUNTER — Ambulatory Visit: Payer: Medicare Other | Admitting: Physical Therapy

## 2021-09-08 DIAGNOSIS — M5459 Other low back pain: Secondary | ICD-10-CM | POA: Diagnosis not present

## 2021-09-08 DIAGNOSIS — M6281 Muscle weakness (generalized): Secondary | ICD-10-CM | POA: Diagnosis not present
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
## 2021-09-08 NOTE — Patient Instructions (Signed)
Access Code: [email or web address] URL: [email or web address] Date: 09/08/2021 Prepared by: Hilda Blades  Exercises - Seated Sciatic Tensioner  - 1 x daily - 7 x weekly - 2 sets - 10 reps - Supine Sciatic Nerve Glide  - 1 x daily - 7 x weekly - 2 sets - 10 reps - Supine Lower Trunk Rotation  - 1 x daily - 7 x weekly - 2 sets - 10 reps - Supine Posterior Pelvic Tilt  - 1 x daily - 7 x weekly - 2 sets - 10 reps - 5 sec hold - Supine Piriformis Stretch with Leg Straight  - 1 x daily - 7 x weekly - 2 reps - 30 sec hold - Posterior Pelvic Tilt with Adduction Using Pillow in Hooklying  - 1 x daily - 7 x weekly - 2-3 sets - 10 reps - 5 sec hold - Supine 90/90 Abdominal Bracing  - 1 x daily - 7 x weekly - 2-3 reps - 15 sec hold - Hooklying Clamshell with Resistance  - 1 x daily - 7 x weekly - 2-3 sets - 15 reps - green theraband hold - Small Range Straight Leg Raise  - 1 x daily - 7 x weekly - 2-3 sets - 10 reps - Clamshell with Resistance  - 1 x daily - 7 x weekly - 2 sets - 15 reps - green theraband hold - Supine Double Knee to Chest  - 1 x daily - 7 x weekly - 1 sets - 10 reps - Sit to Stand  - 1 x daily - 7 x weekly - 2 sets - 10 reps

## 2021-09-10 ENCOUNTER — Ambulatory Visit: Payer: Medicare Other

## 2021-09-10 DIAGNOSIS — M6281 Muscle weakness (generalized): Secondary | ICD-10-CM

## 2021-09-10 DIAGNOSIS — M5459 Other low back pain: Secondary | ICD-10-CM | POA: Diagnosis not present

## 2021-09-10 NOTE — Therapy (Signed)
OUTPATIENT PHYSICAL THERAPY TREATMENT NOTE   Patient Name: Kathryn Mann MRN: [id number] DOB:1935/06/12, 86 y.o., female Today's Date: 09/10/2021  PCP: Ginger Organ., MD REFERRING PROVIDER: Dawley, Theodoro Doing, DO  END OF SESSION:   PT End of Session - 09/10/21 1630     Visit Number 18    Number of Visits 22    Date for PT Re-Evaluation 09/25/21    Authorization Type Medicare    PT Start Time 1631   patient late   PT Stop Time 1659    PT Time Calculation (min) 28 min    Activity Tolerance Patient tolerated treatment well    Behavior During Therapy WFL for tasks assessed/performed                 Past Medical History:  Diagnosis Date   GERD (gastroesophageal reflux disease)    Hypertension    Past Surgical History:  Procedure Laterality Date   ABDOMINAL HYSTERECTOMY     FOOT SURGERY     right foot surgery   There are no problems to display for this patient.   REFERRING DIAG: M48.061 (ICD-10-CM) - Spinal stenosis, lumbar region without neurogenic claudication  THERAPY DIAG:  Other low back pain  Muscle weakness (generalized)  Rationale for Evaluation and Treatment Rehabilitation  PERTINENT HISTORY: HTN  PRECAUTIONS: None  SUBJECTIVE: "I feel terrible. I hurt all over. I think it's that storm coming in"  PAIN:  Are you having pain?  No: NPRS scale: 6/10 Pain location: generalized pain Pain description: sharp Aggravating factors: prolonged sitting, weather Relieving factors: medication   OBJECTIVE: (objective measures completed at initial evaluation unless otherwise dated)  POSTURE:  Small body habitus, increased thoracic kyphosis, fwd head   PALPATION: TTP to R lumbar paraspinals   LE MMT:   MMT Right 06/16/2021 Left 06/16/2021 Right 08/12/21 Left 08/12/21 Rt / Lt 09/08/2021  Hip flexion  3+/5 4/5 4/5 4/5   Hip extension         Hip abduction 3+/5 4/5 4/5 4/5 4- / 4-  Hip adduction 3+/5 4/5 4/5 4/5   Hip external rotation         Hip  internal rotation         Knee extension 4/5 4/5 4/5 4/5   Knee flexion 3+/5 4/5 3+/5 4/5   Ankle dorsiflexion          Ankle plantarflexion         Ankle inversion         Ankle eversion         Grossly         (Blank rows = not tested)   LUMBAR SPECIAL TESTS:  Straight leg raise test: Negative and Slump test: Positive   FUNCTIONAL TESTS:  30 Second Sit to Stand: 12 reps 08/12/2021: 12 reps   GAIT: Distance walked: 75f Assistive device utilized: None Level of assistance: Complete Independence Comments: decreased trunk extension, slowed gait speed      TODAY'S TREATMENT  [facility] Adult PT Treatment:                                                DATE: 09/10/21 Therapeutic Exercise: Double knees to chest 1 x 10  Sidelying hip abduction 2 x 10  Seated march with opposite arm reach 2 x 10  Seated row red band 2  x 10  TA brace with stability ball in sitting 2 x 10; 5 sec hold  Sit to stand 1 x 10   [facility] Adult PT Treatment:                                                DATE: 09/08/21 Therapeutic Exercise: NuStep L5 x 5 min with UE/LE while taking subjective Seated lumbar flexion with physioball 10 x 5 sec Seated hamstring stretch 2 x 20 sec each Sit to stand with hands on thighs 2 x 10 Supine piriformis stretch 2 x 20 sec each LTR 10 x 5 sec Bridge with ball squeeze 2 x 10 - partial range 90-90 alternating heel tap 2 x 10 Sidelying hip abduction 2 x 10 each   [facility] Adult PT Treatment:                                                DATE: 09/04/21 Therapeutic Exercise: NuStep L5 x 5 min with UE/LE while taking subjective LTR 10 x 5 sec Supine piriformis stretch 2 x 20 sec each SLR 2 x 10 each Bridge 2 x 10 - partial range Hooklying clamshell with blue 2 x 10 Seated lumbar flexion with physioball 10 x 5 sec  PATIENT EDUCATION:  Education details:N/A Person educated: N/A Education method: N/A Education comprehension: N/A   HOME EXERCISE PROGRAM: Access Code: [email or web address]     ASSESSMENT: CLINICAL IMPRESSION:  Continued emphasis on flexion biased movement and progression of core and hip strengthening, which she tolerated well. She is challenged in maintaining proper posture during dynamic core stabilization in sitting requiring consistent cues to maintain core engagement. She has difficulty controlling descent of sit <>stand. She reports improvement in her pain at conclusion of session.   OBJECTIVE IMPAIRMENTS decreased balance, decreased endurance, decreased mobility, difficulty walking, decreased ROM, decreased strength, improper body mechanics, postural dysfunction, and pain.    ACTIVITY LIMITATIONS cleaning, community activity, laundry, and yard work.    PERSONAL FACTORS Time since onset of injury/illness/exacerbation and 1 comorbidity: HTN  are also affecting patient's functional outcome.     GOALS: Goals reviewed with patient? No   SHORT TERM GOALS: Target date: 07/08/2021     Pt will be compliant and knowledgeable with initial HEP for improved comfort and carryover Baseline: initial HEP given  Goal status: MET   2.  Pt will self report lower back and right LE pain no greater than 6/10 for improved comfort and functional ability Baseline: 7/10 at worst Goal status: Ongoing   LONG TERM GOALS: Target date: 09/25/2021   Pt will improve FOTO function score to no less than predicted value as proxy for functional improvement Baseline:  Goal status: INITIAL   2.  Pt will self report lower back and right LE pain no greater than 2/10 for improved comfort and functional ability Baseline: 7/10 at worst Goal status: Ongoing    3.  Pt will improve R LE MMT to no less than 4+/5 for all tested motions for improved functional mobility Baseline: see chart Goal status: Progressing; see updated chart 08/12/2021   4.  Pt will increase 30 Second Sit to Stand rep count to no less than 14 reps for improved balance, strength,  and functional mobility Baseline: 12 reps   Goal status: Ongoing 12 reps 08/12/21    PLAN: PT FREQUENCY: 2x/week   PT DURATION: 8 weeks   PLANNED INTERVENTIONS: Therapeutic exercises, Therapeutic activity, Neuromuscular re-education, Balance training, Gait training, Patient/Family education, Joint mobilization, Aquatic Therapy, Dry Needling, Electrical stimulation, Cryotherapy, Moist heat, Manual therapy, and Re-evaluation.   PLAN FOR NEXT SESSION: assess HEP response, assess response to flexion biased movement, core/hip strengthening, postural strengthening; kx modified   Gwendolyn Grant, PT, DPT, ATC 09/10/21 5:00 PM

## 2021-09-15 ENCOUNTER — Ambulatory Visit: Payer: Medicare Other

## 2021-09-15 ENCOUNTER — Ambulatory Visit: Payer: Medicare Other | Admitting: Physical Therapy

## 2021-09-15 DIAGNOSIS — M48061 Spinal stenosis, lumbar region without neurogenic claudication: Secondary | ICD-10-CM | POA: Diagnosis not present

## 2021-09-15 DIAGNOSIS — M5459 Other low back pain: Secondary | ICD-10-CM

## 2021-09-15 DIAGNOSIS — M6281 Muscle weakness (generalized): Secondary | ICD-10-CM

## 2021-09-15 DIAGNOSIS — Z6826 Body mass index (BMI) 26.0-26.9, adult: Secondary | ICD-10-CM | POA: Diagnosis not present

## 2021-09-15 NOTE — Therapy (Signed)
OUTPATIENT PHYSICAL THERAPY TREATMENT NOTE   Patient Name: Kathryn Mann MRN: [id number] DOB:1935/12/04, 86 y.o., female Today's Date: 09/15/2021  PCP: Ginger Organ., MD REFERRING PROVIDER: Dawley, Theodoro Doing, DO  END OF SESSION:   PT End of Session - 09/15/21 1510     Visit Number 19    Number of Visits 22    Date for PT Re-Evaluation 09/25/21    Authorization Type Medicare    PT Start Time 1511   patient late   PT Stop Time 1542    PT Time Calculation (min) 31 min    Activity Tolerance Patient tolerated treatment well    Behavior During Therapy WFL for tasks assessed/performed                  Past Medical History:  Diagnosis Date   GERD (gastroesophageal reflux disease)    Hypertension    Past Surgical History:  Procedure Laterality Date   ABDOMINAL HYSTERECTOMY     FOOT SURGERY     right foot surgery   There are no problems to display for this patient.   REFERRING DIAG: M48.061 (ICD-10-CM) - Spinal stenosis, lumbar region without neurogenic claudication  THERAPY DIAG:  Other low back pain  Muscle weakness (generalized)  Rationale for Evaluation and Treatment Rehabilitation  PERTINENT HISTORY: HTN  PRECAUTIONS: None  SUBJECTIVE:  Patient reports feeling better this afternoon compared to this morning attributed to the weather.  PAIN:  Are you having pain?  No: NPRS scale: 3/10 Pain location: RLE Pain description: sharp Aggravating factors: prolonged sitting, weather Relieving factors: medication   OBJECTIVE: (objective measures completed at initial evaluation unless otherwise dated)  POSTURE:  Small body habitus, increased thoracic kyphosis, fwd head   PALPATION: TTP to R lumbar paraspinals   LE MMT:   MMT Right 06/16/2021 Left 06/16/2021 Right 08/12/21 Left 08/12/21 Rt / Lt 09/08/2021 09/15/21  Hip flexion  3+/5 4/5 4/5 4/5  4/5 bilateral   Hip extension          Hip abduction 3+/5 4/5 4/5 4/5 4- / 4-   Hip adduction 3+/5 4/5 4/5  4/5    Hip external rotation          Hip internal rotation          Knee extension 4/5 4/5 4/5 4/5    Knee flexion 3+/5 4/5 3+/5 4/5    Ankle dorsiflexion           Ankle plantarflexion          Ankle inversion          Ankle eversion          Grossly          (Blank rows = not tested)   LUMBAR SPECIAL TESTS:  Straight leg raise test: Negative and Slump test: Positive   FUNCTIONAL TESTS:  30 Second Sit to Stand: 12 reps 08/12/2021: 12 reps   GAIT: Distance walked: 35f Assistive device utilized: None Level of assistance: Complete Independence Comments: decreased trunk extension, slowed gait speed      TODAY'S TREATMENT  [facility] Adult PT Treatment:                                                DATE: 09/15/21 Therapeutic Exercise: Double knees to chest 1 x 10  Sit to stand 2 x  10  Standing hip abduction 2 x 10  Standing hip extension 2 x 10  Standing march 2 x 10  LAQ 2# 2 x 10  Standing calf raise 2 x 10   [facility] Adult PT Treatment:                                                DATE: 09/10/21 Therapeutic Exercise: Double knees to chest 1 x 10  Sidelying hip abduction 2 x 10  Seated march with opposite arm reach 2 x 10  Seated row red band 2 x 10  TA brace with stability ball in sitting 2 x 10; 5 sec hold  Sit to stand 1 x 10   [facility] Adult PT Treatment:                                                DATE: 09/08/21 Therapeutic Exercise: NuStep L5 x 5 min with UE/LE while taking subjective Seated lumbar flexion with physioball 10 x 5 sec Seated hamstring stretch 2 x 20 sec each Sit to stand with hands on thighs 2 x 10 Supine piriformis stretch 2 x 20 sec each LTR 10 x 5 sec Bridge with ball squeeze 2 x 10 - partial range 90-90 alternating heel tap 2 x 10 Sidelying hip abduction 2 x 10 each    PATIENT EDUCATION:  Education details:N/A Person educated: N/A Education method: N/A Education comprehension: N/A   HOME EXERCISE PROGRAM: Access Code: [email or web address]     ASSESSMENT: CLINICAL IMPRESSION: Session was limited as patient was late for scheduled visit. Continued emphasis on flexion biased moved and progression of core and hip strengthening with overall good tolerance. Continues to be challenged with sit to stand having difficulty controlling the descent with occasional hip shift present to either side. With standing activity she quickly fatigues requiring seated rest breaks between sets and requires consistent postural cues to maintain trunk alignment and decrease shoulder shrug. No reports of increased pain throughout session.    OBJECTIVE IMPAIRMENTS decreased balance, decreased endurance, decreased mobility, difficulty walking, decreased ROM, decreased strength, improper body mechanics, postural dysfunction, and pain.    ACTIVITY LIMITATIONS cleaning, community activity, laundry, and yard work.    PERSONAL FACTORS Time since onset of injury/illness/exacerbation and 1 comorbidity: HTN  are also affecting patient's functional outcome.     GOALS: Goals reviewed with patient? No   SHORT TERM GOALS: Target date: 07/08/2021     Pt will be compliant and knowledgeable with initial HEP for improved comfort and carryover Baseline: initial HEP given  Goal status: MET   2.  Pt will self report lower back and right LE pain no greater than 6/10 for improved comfort and functional ability Baseline: 7/10 at worst; 09/15/21: 9/10 at worst attributed to the "barometric pressure."  Goal status: Ongoing   LONG TERM GOALS: Target date: 09/25/2021   Pt will improve FOTO function score to no less than predicted value as proxy for functional improvement Baseline:  Goal status: INITIAL   2.  Pt will self report lower back and right LE pain no greater than 2/10 for improved comfort and functional ability Baseline: 7/10 at worst Goal status: Ongoing    3.  Pt will improve  R LE MMT to no less than 4+/5 for all tested motions for improved functional  mobility Baseline: see chart Goal status: Progressing; see updated chart 08/12/2021   4.  Pt will increase 30 Second Sit to Stand rep count to no less than 14 reps for improved balance, strength, and functional mobility Baseline: 12 reps  Goal status: Ongoing 12 reps 08/12/21    PLAN: PT FREQUENCY: 2x/week   PT DURATION: 8 weeks   PLANNED INTERVENTIONS: Therapeutic exercises, Therapeutic activity, Neuromuscular re-education, Balance training, Gait training, Patient/Family education, Joint mobilization, Aquatic Therapy, Dry Needling, Electrical stimulation, Cryotherapy, Moist heat, Manual therapy, and Re-evaluation.   PLAN FOR NEXT SESSION: assess HEP response, assess response to flexion biased movement, core/hip strengthening, postural strengthening; kx modified   Gwendolyn Grant, PT, DPT, ATC 09/15/21 3:43 PM

## 2021-09-17 ENCOUNTER — Ambulatory Visit: Payer: Medicare Other

## 2021-09-22 ENCOUNTER — Ambulatory Visit: Payer: Medicare Other

## 2021-09-22 DIAGNOSIS — M5459 Other low back pain: Secondary | ICD-10-CM | POA: Diagnosis not present

## 2021-09-22 DIAGNOSIS — M6281 Muscle weakness (generalized): Secondary | ICD-10-CM | POA: Diagnosis not present

## 2021-09-22 NOTE — Therapy (Addendum)
OUTPATIENT PHYSICAL THERAPY TREATMENT NOTE   Patient Name: Kathryn Mann MRN: [id number] DOB:[birth year], 86 y.o., female Today's Date: 09/29/2021  PCP: Ginger Organ., MD REFERRING PROVIDER: Dawley, Theodoro Doing, DO  END OF SESSION:   PT End of Session - 09/22/21 1628       Visit Number 20     Number of Visits 22     Date for PT Re-Evaluation 09/25/21     Authorization Type Medicare     PT Start Time 1626   arrived late    PT Stop Time 1704     PT Time Calculation (min) 38 min     Activity Tolerance Patient tolerated treatment well     Behavior During Therapy WFL for tasks assessed/performed      Past Medical History:  Diagnosis Date   GERD (gastroesophageal reflux disease)    Hypertension    Past Surgical History:  Procedure Laterality Date   ABDOMINAL HYSTERECTOMY     FOOT SURGERY     right foot surgery   There are no problems to display for this patient.   REFERRING DIAG: M48.061 (ICD-10-CM) - Spinal stenosis, lumbar region without neurogenic claudication  THERAPY DIAG:  Other low back pain  Muscle weakness (generalized)  Rationale for Evaluation and Treatment Rehabilitation  PERTINENT HISTORY: HTN  PRECAUTIONS: None  SUBJECTIVE:  Pt presents to PT with reports of lower back discomfort. Pt has been compliant with HEP with no adverse effect. Pt is ready to begin PT at this time.  PAIN:  Are you having pain?  No: NPRS scale: 3/10 Pain location: RLE Pain description: sharp Aggravating factors: prolonged sitting, weather Relieving factors: medication   OBJECTIVE: (objective measures completed at initial evaluation unless otherwise dated)  POSTURE:  Small body habitus, increased thoracic kyphosis, fwd head   PALPATION: TTP to R lumbar paraspinals   LE MMT:   MMT Right 06/16/2021 Left 06/16/2021 Right 08/12/21 Left 08/12/21 Rt / Lt 09/08/2021 09/15/21  Hip flexion  3+/5 4/5 4/5 4/5  4/5 bilateral   Hip extension          Hip abduction 3+/5 4/5  4/5 4/5 4- / 4-   Hip adduction 3+/5 4/5 4/5 4/5    Hip external rotation          Hip internal rotation          Knee extension 4/5 4/5 4/5 4/5    Knee flexion 3+/5 4/5 3+/5 4/5    Ankle dorsiflexion           Ankle plantarflexion          Ankle inversion          Ankle eversion          Grossly          (Blank rows = not tested)   LUMBAR SPECIAL TESTS:  Straight leg raise test: Negative and Slump test: Positive   FUNCTIONAL TESTS:  30 Second Sit to Stand: 12 reps 08/12/2021: 12 reps   GAIT: Distance walked: 28f Assistive device utilized: None Level of assistance: Complete Independence Comments: decreased trunk extension, slowed gait speed      TODAY'S TREATMENT  [facility] Adult PT Treatment:                                                DATE: 09/22/21 Therapeutic Exercise:  SKTC 2x30" each DKTC 2x30" Supine PPT x 10 - 5" hold Supine PPT with march x 10  Sit to stand 2x10 - 5# KB Seated fwd physioball rollout x 15 - 3" hold  Standing hip abduction 2x10 2# Standing hip extension 2x10 2# Standing march 2# x 10 Step ups x 10 6in step  PATIENT EDUCATION:  Education details:N/A Person educated: N/A Education method: N/A Education comprehension: N/A   HOME EXERCISE PROGRAM: Access Code: [email or web address]    ASSESSMENT:  CLINICAL IMPRESSION: Pt was able to complete prescribed exercises with no adverse effect, noted decrease in pain post flexion-based stretching. Therapy also continued to focus on core and proximal hip strengthening in order to decrease pain and improve functional mobility. Will assess LTGs at next session and consider modification of POC or discharge.    OBJECTIVE IMPAIRMENTS decreased balance, decreased endurance, decreased mobility, difficulty walking, decreased ROM, decreased strength, improper body mechanics, postural dysfunction, and pain.    ACTIVITY LIMITATIONS cleaning, community activity, laundry, and yard work.    PERSONAL FACTORS Time since onset of  injury/illness/exacerbation and 1 comorbidity: HTN  are also affecting patient's functional outcome.     GOALS: Goals reviewed with patient? No   SHORT TERM GOALS: Target date: 07/08/2021     Pt will be compliant and knowledgeable with initial HEP for improved comfort and carryover Baseline: initial HEP given  Goal status: MET   2.  Pt will self report lower back and right LE pain no greater than 6/10 for improved comfort and functional ability Baseline: 7/10 at worst; 09/15/21: 9/10 at worst attributed to the "barometric pressure."  Goal status: Ongoing   LONG TERM GOALS: Target date: 09/25/2021   Pt will improve FOTO function score to no less than predicted value as proxy for functional improvement Baseline:  Goal status: INITIAL   2.  Pt will self report lower back and right LE pain no greater than 2/10 for improved comfort and functional ability Baseline: 7/10 at worst Goal status: Ongoing    3.  Pt will improve R LE MMT to no less than 4+/5 for all tested motions for improved functional mobility Baseline: see chart Goal status: Progressing; see updated chart 08/12/2021   4.  Pt will increase 30 Second Sit to Stand rep count to no less than 14 reps for improved balance, strength, and functional mobility Baseline: 12 reps  Goal status: Ongoing 12 reps 08/12/21    PLAN: PT FREQUENCY: 2x/week   PT DURATION: 8 weeks   PLANNED INTERVENTIONS: Therapeutic exercises, Therapeutic activity, Neuromuscular re-education, Balance training, Gait training, Patient/Family education, Joint mobilization, Aquatic Therapy, Dry Needling, Electrical stimulation, Cryotherapy, Moist heat, Manual therapy, and Re-evaluation.   PLAN FOR NEXT SESSION: assess HEP response, assess response to flexion biased movement, core/hip strengthening, postural strengthening; kx modified    Ward Chatters PT  09/29/21 9:12 AM

## 2021-09-23 ENCOUNTER — Other Ambulatory Visit: Payer: Self-pay | Admitting: Physician Assistant

## 2021-09-23 DIAGNOSIS — R634 Abnormal weight loss: Secondary | ICD-10-CM

## 2021-09-23 DIAGNOSIS — R1011 Right upper quadrant pain: Secondary | ICD-10-CM

## 2021-09-23 DIAGNOSIS — K769 Liver disease, unspecified: Secondary | ICD-10-CM | POA: Diagnosis not present

## 2021-09-23 DIAGNOSIS — M549 Dorsalgia, unspecified: Secondary | ICD-10-CM | POA: Diagnosis not present

## 2021-09-23 DIAGNOSIS — R9389 Abnormal findings on diagnostic imaging of other specified body structures: Secondary | ICD-10-CM | POA: Diagnosis not present

## 2021-09-24 ENCOUNTER — Ambulatory Visit: Payer: Medicare Other

## 2021-09-24 DIAGNOSIS — M6281 Muscle weakness (generalized): Secondary | ICD-10-CM

## 2021-09-24 DIAGNOSIS — M5459 Other low back pain: Secondary | ICD-10-CM | POA: Diagnosis not present

## 2021-09-24 NOTE — Therapy (Signed)
OUTPATIENT PHYSICAL THERAPY TREATMENT NOTE   Patient Name: Kathryn Mann MRN: [id number] DOB:1935/09/10, 85 y.o., female Today's Date: 09/24/2021  PCP: Ginger Organ., MD REFERRING PROVIDER: Dawley, Theodoro Doing, DO  END OF SESSION:   PT End of Session - 09/24/21 1627     Visit Number 21    Number of Visits 22    Date for PT Re-Evaluation 09/25/21    Authorization Type Medicare    PT Start Time 1627    PT Stop Time 1700    PT Time Calculation (min) 33 min    Activity Tolerance Patient tolerated treatment well    Behavior During Therapy WFL for tasks assessed/performed                    Past Medical History:  Diagnosis Date   GERD (gastroesophageal reflux disease)    Hypertension    Past Surgical History:  Procedure Laterality Date   ABDOMINAL HYSTERECTOMY     FOOT SURGERY     right foot surgery   There are no problems to display for this patient.   REFERRING DIAG: M48.061 (ICD-10-CM) - Spinal stenosis, lumbar region without neurogenic claudication  THERAPY DIAG:  Other low back pain  Muscle weakness (generalized)  Rationale for Evaluation and Treatment Rehabilitation  PERTINENT HISTORY: HTN  PRECAUTIONS: None  SUBJECTIVE:  Pt presents PT with reports of decreased lower back pain and discomfort compared to previous session. Notes it continues to be dependent on weather.   PAIN:  Are you having pain?  No: NPRS scale: 3/10 Pain location: RLE Pain description: sharp Aggravating factors: prolonged sitting, weather Relieving factors: medication   OBJECTIVE: (objective measures completed at initial evaluation unless otherwise dated)  POSTURE:  Small body habitus, increased thoracic kyphosis, fwd head   PALPATION: TTP to R lumbar paraspinals   LE MMT:   MMT Right 06/16/2021 Left 06/16/2021 Right 08/12/21 Left 08/12/21 Rt / Lt 09/08/2021 09/15/21  Hip flexion  3+/5 4/5 4/5 4/5  4/5 bilateral   Hip extension          Hip abduction 3+/5 4/5  4/5 4/5 4- / 4-   Hip adduction 3+/5 4/5 4/5 4/5    Hip external rotation          Hip internal rotation          Knee extension 4/5 4/5 4/5 4/5    Knee flexion 3+/5 4/5 3+/5 4/5    Ankle dorsiflexion           Ankle plantarflexion          Ankle inversion          Ankle eversion          Grossly          (Blank rows = not tested)   LUMBAR SPECIAL TESTS:  Straight leg raise test: Negative and Slump test: Positive   FUNCTIONAL TESTS:  30 Second Sit to Stand: 12 reps 08/12/2021: 12 reps 09/24/2021: 12 reps   GAIT: Distance walked: 54f Assistive device utilized: None Level of assistance: Complete Independence Comments: decreased trunk extension, slowed gait speed      TODAY'S TREATMENT  [facility] Adult PT Treatment:                                                DATE: 09/24/21 Therapeutic Exercise:  DKTC 2x30" Supine PPT x 15 - 5" hold Supine PPT with march x 10  Sit to stand x 10  Seated repeated lumbar flexion x 15 - 3" hold  Standing hip abd/ext x 10 RTB Therapeutic Activity: Assessment of tests/measures, goals, and outcomes   [facility] Adult PT Treatment:                                                DATE: 09/22/21 Therapeutic Exercise: SKTC 2x30" each DKTC 2x30" Supine PPT x 10 - 5" hold Supine PPT with march x 10  Sit to stand 2x10 - 5# KB Seated fwd physioball rollout x 15 - 3" hold  Standing hip abduction 2x10 2# Standing hip extension 2x10 2# Standing march 2# x 10 Step ups x 10 6in step  PATIENT EDUCATION:  Education details:N/A Person educated: N/A Education method: N/A Education comprehension: N/A   HOME EXERCISE PROGRAM: Access Code: [email or web address] URL: [email or web address] Date: 09/24/2021 Prepared by: [name]  Exercises - Supine Double Knee to Chest  - 1 x daily - 7 x weekly - 3 reps - 30 sec hold - Supine Posterior Pelvic Tilt  - 1 x daily - 7 x weekly - 2 sets - 10 reps - 5 sec hold - Supine March with Posterior Pelvic Tilt  - 1 x  daily - 7 x weekly - 2 sets - 10 reps - Posterior Pelvic Tilt with Adduction Using Pillow in Hooklying  - 1 x daily - 7 x weekly - 2-3 sets - 10 reps - 5 sec hold - Supine Lower Trunk Rotation  - 1 x daily - 7 x weekly - 2 sets - 10 reps - Clamshell with Resistance  - 1 x daily - 7 x weekly - 3 sets - 10-15 reps - green tband hold - Sit to Stand  - 1 x daily - 7 x weekly - 2 sets - 10 reps - Seated Lumbar Flexion Stretch  - 1 x daily - 7 x weekly - 2 sets - 10 reps - 5 sec hold - Standing Hip Abduction with Resistance at Ankles and Counter Support  - 1 x daily - 7 x weekly - 3 sets - 10 reps - Standing Hip Extension with Resistance at Ankles and Counter Support  - 1 x daily - 7 x weekly - 3 sets - 10 reps - Side Stepping with Resistance at Ankles and Counter Support  - 1 x daily - 7 x weekly - 3 sets - 10 reps   ASSESSMENT:  CLINICAL IMPRESSION: Pt was able to complete all prescribed exercises and demonstrated knowledge of HEP with no adverse effect. Over the course of PT treatment she has progressed well with increasing her LE strength and activity tolerance, but unfortunately has hit a plateau in progress with therapy. She should continue to improve slightly with HEP compliance, but after discussion PT explained that it would be best see physician again and discharging from skilled PT at present. Pt in agreement with current plan and is ready to discharge from PT at this time.   OBJECTIVE IMPAIRMENTS decreased balance, decreased endurance, decreased mobility, difficulty walking, decreased ROM, decreased strength, improper body mechanics, postural dysfunction, and pain.    ACTIVITY LIMITATIONS cleaning, community activity, laundry, and yard work.    PERSONAL FACTORS Time since onset of injury/illness/exacerbation and 1 comorbidity: HTN  are also affecting patient's functional outcome.     GOALS: Goals reviewed with patient? No   SHORT TERM GOALS: Target date: 07/08/2021     Pt will be  compliant and knowledgeable with initial HEP for improved comfort and carryover Baseline: initial HEP given  Goal status: MET   2.  Pt will self report lower back and right LE pain no greater than 6/10 for improved comfort and functional ability Baseline: 7/10 at worst 09/15/21: 9/10 at worst attributed to the "barometric pressure" 09/24/2021: 7/10 at worst Goal status: NOT MET   LONG TERM GOALS: Target date: 09/25/2021   Pt will improve FOTO function score to no less than predicted value as proxy for functional improvement Baseline:  Goal status: NOT MET   2.  Pt will self report lower back and right LE pain no greater than 2/10 for improved comfort and functional ability Baseline: 7/10 at worst 09/24/2021: 7/10 at worst Goal status: NOT MET   3.  Pt will improve R LE MMT to no less than 4+/5 for all tested motions for improved functional mobility Baseline: see chart Goal status: Progressing; see updated chart 08/12/2021   4.  Pt will increase 30 Second Sit to Stand rep count to no less than 14 reps for improved balance, strength, and functional mobility Baseline: 12 reps 08/12/2021: 12 reps 09/24/2021: 12 reps Goal status: NOT MET    PLAN: PT FREQUENCY: 2x/week   PT DURATION: 8 weeks   PLANNED INTERVENTIONS: Therapeutic exercises, Therapeutic activity, Neuromuscular re-education, Balance training, Gait training, Patient/Family education, Joint mobilization, Aquatic Therapy, Dry Needling, Electrical stimulation, Cryotherapy, Moist heat, Manual therapy, and Re-evaluation.   PLAN FOR NEXT SESSION: assess HEP response, assess response to flexion biased movement, core/hip strengthening, postural strengthening; kx modified    Ward Chatters PT  09/24/21 5:30 PM      /

## 2021-09-25 ENCOUNTER — Ambulatory Visit
Admission: RE | Admit: 2021-09-25 | Discharge: 2021-09-25 | Disposition: A | Discharge status: Home / Self Care | Payer: Medicare Other | Source: Ambulatory Visit | Attending: Physician Assistant | Admitting: Physician Assistant

## 2021-09-25 DIAGNOSIS — R1011 Right upper quadrant pain: Secondary | ICD-10-CM

## 2021-09-25 DIAGNOSIS — R634 Abnormal weight loss: Secondary | ICD-10-CM

## 2021-09-29 ENCOUNTER — Other Ambulatory Visit: Payer: Self-pay | Admitting: Physician Assistant

## 2021-09-29 DIAGNOSIS — R9389 Abnormal findings on diagnostic imaging of other specified body structures: Secondary | ICD-10-CM

## 2021-09-29 DIAGNOSIS — K769 Liver disease, unspecified: Secondary | ICD-10-CM

## 2021-10-20 DIAGNOSIS — Z6826 Body mass index (BMI) 26.0-26.9, adult: Secondary | ICD-10-CM | POA: Diagnosis not present

## 2021-10-20 DIAGNOSIS — M48061 Spinal stenosis, lumbar region without neurogenic claudication: Secondary | ICD-10-CM | POA: Diagnosis not present

## 2021-10-23 ENCOUNTER — Ambulatory Visit
Admission: RE | Admit: 2021-10-23 | Discharge: 2021-10-23 | Disposition: A | Discharge status: Home / Self Care | Payer: Medicare Other | Source: Ambulatory Visit | Attending: Physician Assistant | Admitting: Physician Assistant

## 2021-10-23 DIAGNOSIS — K449 Diaphragmatic hernia without obstruction or gangrene: Secondary | ICD-10-CM | POA: Diagnosis not present

## 2021-10-23 DIAGNOSIS — N281 Cyst of kidney, acquired: Secondary | ICD-10-CM | POA: Diagnosis not present

## 2021-10-23 DIAGNOSIS — M47817 Spondylosis without myelopathy or radiculopathy, lumbosacral region: Secondary | ICD-10-CM | POA: Diagnosis not present

## 2021-10-23 DIAGNOSIS — I862 Pelvic varices: Secondary | ICD-10-CM | POA: Diagnosis not present

## 2021-10-23 DIAGNOSIS — R9389 Abnormal findings on diagnostic imaging of other specified body structures: Secondary | ICD-10-CM

## 2021-10-23 DIAGNOSIS — K769 Liver disease, unspecified: Secondary | ICD-10-CM

## 2021-10-23 MED ORDER — IOPAMIDOL (ISOVUE-300) INJECTION 61%
100.0000 mL | Freq: Once | INTRAVENOUS | Status: AC | PRN
  Administered 2021-10-23: 100 mL via INTRAVENOUS

## 2021-10-24 DIAGNOSIS — H353132 Nonexudative age-related macular degeneration, bilateral, intermediate dry stage: Secondary | ICD-10-CM | POA: Diagnosis not present

## 2021-11-10 ENCOUNTER — Other Ambulatory Visit (HOSPITAL_COMMUNITY): Payer: Self-pay | Admitting: Physician Assistant

## 2021-11-10 ENCOUNTER — Other Ambulatory Visit: Payer: Self-pay | Admitting: Physician Assistant

## 2021-11-10 DIAGNOSIS — R1011 Right upper quadrant pain: Secondary | ICD-10-CM | POA: Diagnosis not present

## 2021-11-10 DIAGNOSIS — R1310 Dysphagia, unspecified: Secondary | ICD-10-CM

## 2021-11-10 DIAGNOSIS — K449 Diaphragmatic hernia without obstruction or gangrene: Secondary | ICD-10-CM

## 2021-11-10 DIAGNOSIS — K219 Gastro-esophageal reflux disease without esophagitis: Secondary | ICD-10-CM | POA: Diagnosis not present

## 2021-11-11 ENCOUNTER — Other Ambulatory Visit (HOSPITAL_COMMUNITY): Payer: Self-pay | Admitting: Physician Assistant

## 2021-11-11 DIAGNOSIS — R1310 Dysphagia, unspecified: Secondary | ICD-10-CM

## 2021-11-11 DIAGNOSIS — K449 Diaphragmatic hernia without obstruction or gangrene: Secondary | ICD-10-CM

## 2021-12-09 ENCOUNTER — Ambulatory Visit (HOSPITAL_COMMUNITY)
Admission: RE | Admit: 2021-12-09 | Discharge: 2021-12-09 | Disposition: A | Discharge status: Home / Self Care | Payer: Medicare Other | Source: Ambulatory Visit | Attending: Physician Assistant | Admitting: Physician Assistant

## 2021-12-09 DIAGNOSIS — R1310 Dysphagia, unspecified: Secondary | ICD-10-CM | POA: Insufficient documentation

## 2021-12-09 DIAGNOSIS — K219 Gastro-esophageal reflux disease without esophagitis: Secondary | ICD-10-CM | POA: Diagnosis not present

## 2021-12-09 DIAGNOSIS — K449 Diaphragmatic hernia without obstruction or gangrene: Secondary | ICD-10-CM | POA: Diagnosis not present

## 2021-12-11 ENCOUNTER — Encounter (HOSPITAL_COMMUNITY)
Admission: RE | Admit: 2021-12-11 | Discharge: 2021-12-11 | Disposition: A | Discharge status: Home / Self Care | Payer: Medicare Other | Source: Ambulatory Visit | Attending: Physician Assistant | Admitting: Physician Assistant

## 2021-12-11 DIAGNOSIS — R1011 Right upper quadrant pain: Secondary | ICD-10-CM | POA: Insufficient documentation

## 2021-12-11 DIAGNOSIS — M549 Dorsalgia, unspecified: Secondary | ICD-10-CM | POA: Diagnosis not present

## 2021-12-11 MED ORDER — TECHNETIUM TC 99M MEBROFENIN IV KIT
5.0000 | PACK | Freq: Once | INTRAVENOUS | Status: AC
  Administered 2021-12-11: 5.4 via INTRAVENOUS

## 2021-12-19 DIAGNOSIS — Z23 Encounter for immunization: Secondary | ICD-10-CM | POA: Diagnosis not present

## 2021-12-31 DIAGNOSIS — I700 Atherosclerosis of aorta: Secondary | ICD-10-CM | POA: Diagnosis not present

## 2021-12-31 DIAGNOSIS — E785 Hyperlipidemia, unspecified: Secondary | ICD-10-CM | POA: Diagnosis not present

## 2021-12-31 DIAGNOSIS — I10 Essential (primary) hypertension: Secondary | ICD-10-CM | POA: Diagnosis not present

## 2021-12-31 DIAGNOSIS — E559 Vitamin D deficiency, unspecified: Secondary | ICD-10-CM | POA: Diagnosis not present

## 2022-01-01 DIAGNOSIS — K219 Gastro-esophageal reflux disease without esophagitis: Secondary | ICD-10-CM | POA: Diagnosis not present

## 2022-01-01 DIAGNOSIS — K449 Diaphragmatic hernia without obstruction or gangrene: Secondary | ICD-10-CM | POA: Diagnosis not present

## 2022-01-01 DIAGNOSIS — R1011 Right upper quadrant pain: Secondary | ICD-10-CM | POA: Diagnosis not present

## 2022-01-08 DIAGNOSIS — M25511 Pain in right shoulder: Secondary | ICD-10-CM | POA: Diagnosis not present

## 2022-01-08 DIAGNOSIS — I340 Nonrheumatic mitral (valve) insufficiency: Secondary | ICD-10-CM | POA: Diagnosis not present

## 2022-01-08 DIAGNOSIS — Z1339 Encounter for screening examination for other mental health and behavioral disorders: Secondary | ICD-10-CM | POA: Diagnosis not present

## 2022-01-08 DIAGNOSIS — Z0000 Encounter for general adult medical examination without abnormal findings: Secondary | ICD-10-CM | POA: Diagnosis not present

## 2022-01-08 DIAGNOSIS — I10 Essential (primary) hypertension: Secondary | ICD-10-CM | POA: Diagnosis not present

## 2022-01-08 DIAGNOSIS — E785 Hyperlipidemia, unspecified: Secondary | ICD-10-CM | POA: Diagnosis not present

## 2022-01-08 DIAGNOSIS — M5416 Radiculopathy, lumbar region: Secondary | ICD-10-CM | POA: Diagnosis not present

## 2022-01-08 DIAGNOSIS — M25561 Pain in right knee: Secondary | ICD-10-CM | POA: Diagnosis not present

## 2022-01-08 DIAGNOSIS — R82998 Other abnormal findings in urine: Secondary | ICD-10-CM | POA: Diagnosis not present

## 2022-01-08 DIAGNOSIS — I77810 Thoracic aortic ectasia: Secondary | ICD-10-CM | POA: Diagnosis not present

## 2022-01-08 DIAGNOSIS — K449 Diaphragmatic hernia without obstruction or gangrene: Secondary | ICD-10-CM | POA: Diagnosis not present

## 2022-01-08 DIAGNOSIS — M8580 Other specified disorders of bone density and structure, unspecified site: Secondary | ICD-10-CM | POA: Diagnosis not present

## 2022-01-08 DIAGNOSIS — I868 Varicose veins of other specified sites: Secondary | ICD-10-CM | POA: Diagnosis not present

## 2022-01-08 DIAGNOSIS — I700 Atherosclerosis of aorta: Secondary | ICD-10-CM | POA: Diagnosis not present

## 2022-01-08 DIAGNOSIS — Z1331 Encounter for screening for depression: Secondary | ICD-10-CM | POA: Diagnosis not present

## 2022-02-09 ENCOUNTER — Encounter: Payer: Self-pay | Admitting: Surgery

## 2022-02-09 DIAGNOSIS — Z95828 Presence of other vascular implants and grafts: Secondary | ICD-10-CM

## 2022-02-09 DIAGNOSIS — K440 Diaphragmatic hernia with obstruction, without gangrene: Secondary | ICD-10-CM | POA: Insufficient documentation

## 2022-02-09 DIAGNOSIS — I868 Varicose veins of other specified sites: Secondary | ICD-10-CM | POA: Diagnosis not present

## 2022-02-09 DIAGNOSIS — K3189 Other diseases of stomach and duodenum: Secondary | ICD-10-CM | POA: Diagnosis not present

## 2022-02-09 DIAGNOSIS — R1310 Dysphagia, unspecified: Secondary | ICD-10-CM | POA: Insufficient documentation

## 2022-02-09 DIAGNOSIS — I77810 Thoracic aortic ectasia: Secondary | ICD-10-CM | POA: Insufficient documentation

## 2022-02-09 DIAGNOSIS — M48061 Spinal stenosis, lumbar region without neurogenic claudication: Secondary | ICD-10-CM | POA: Insufficient documentation

## 2022-02-09 HISTORY — DX: Diaphragmatic hernia with obstruction, without gangrene: K44.0

## 2022-02-09 HISTORY — DX: Presence of other vascular implants and grafts: Z95.828

## 2022-02-10 ENCOUNTER — Other Ambulatory Visit: Payer: Self-pay | Admitting: Gastroenterology

## 2022-02-10 DIAGNOSIS — M19111 Post-traumatic osteoarthritis, right shoulder: Secondary | ICD-10-CM | POA: Diagnosis not present

## 2022-02-10 DIAGNOSIS — M25512 Pain in left shoulder: Secondary | ICD-10-CM | POA: Diagnosis not present

## 2022-02-10 DIAGNOSIS — M19012 Primary osteoarthritis, left shoulder: Secondary | ICD-10-CM | POA: Diagnosis not present

## 2022-02-10 DIAGNOSIS — M25511 Pain in right shoulder: Secondary | ICD-10-CM | POA: Diagnosis not present

## 2022-03-12 DIAGNOSIS — M1711 Unilateral primary osteoarthritis, right knee: Secondary | ICD-10-CM | POA: Diagnosis not present

## 2022-03-13 DIAGNOSIS — M1711 Unilateral primary osteoarthritis, right knee: Secondary | ICD-10-CM | POA: Diagnosis not present

## 2022-06-06 ENCOUNTER — Emergency Department (HOSPITAL_BASED_OUTPATIENT_CLINIC_OR_DEPARTMENT_OTHER): Payer: Medicare Other

## 2022-06-06 ENCOUNTER — Encounter (HOSPITAL_BASED_OUTPATIENT_CLINIC_OR_DEPARTMENT_OTHER): Payer: Self-pay | Admitting: Emergency Medicine

## 2022-06-06 ENCOUNTER — Emergency Department (HOSPITAL_BASED_OUTPATIENT_CLINIC_OR_DEPARTMENT_OTHER): Payer: Medicare Other | Admitting: Radiology

## 2022-06-06 ENCOUNTER — Emergency Department (HOSPITAL_BASED_OUTPATIENT_CLINIC_OR_DEPARTMENT_OTHER)
Admission: EM | Admit: 2022-06-06 | Discharge: 2022-06-06 | Disposition: A | Discharge status: Home / Self Care | Payer: Medicare Other | Attending: Emergency Medicine | Admitting: Emergency Medicine

## 2022-06-06 DIAGNOSIS — M7989 Other specified soft tissue disorders: Secondary | ICD-10-CM | POA: Diagnosis not present

## 2022-06-06 DIAGNOSIS — Z7982 Long term (current) use of aspirin: Secondary | ICD-10-CM | POA: Diagnosis not present

## 2022-06-06 DIAGNOSIS — M25571 Pain in right ankle and joints of right foot: Secondary | ICD-10-CM | POA: Diagnosis not present

## 2022-06-06 DIAGNOSIS — I824Y1 Acute embolism and thrombosis of unspecified deep veins of right proximal lower extremity: Secondary | ICD-10-CM | POA: Diagnosis not present

## 2022-06-06 DIAGNOSIS — I825Y1 Chronic embolism and thrombosis of unspecified deep veins of right proximal lower extremity: Secondary | ICD-10-CM | POA: Diagnosis not present

## 2022-06-06 NOTE — ED Provider Notes (Signed)
[place] EMERGENCY DEPARTMENT AT [facility] Provider Note   CSN: [id number] Arrival date & time: 06/06/22  1138     History  Chief Complaint  Patient presents with   Ankle Pain    Kathryn Mann is a 87 y.o. female with a past medical history of spinal stenosis of the lumbar region and osteoarthritis of the right knee presenting today with concern for right ankle swelling.  She reports on Monday she noticed some mild swelling to the right ankle however the swelling has increased over the past 5 days.  She says that it is only mildly painful to palpation on the medial aspect of the distal tibia.  History of DVT and patient has an IVC filter in.  No chest pain, palpitations or shortness of breath.  Non-smoker, no recent travel or surgery.  Patient is still ambulatory.  Does have a history of osteoarthritis of months that she thought this was however it continued to swell so she wanted to make sure she did not have a blood clot   Ankle Pain      Home Medications Prior to Admission medications   Medication Sig Start Date End Date Taking? Authorizing Provider  aspirin 81 MG tablet Take 81 mg by mouth daily. Patient not taking: Reported on 06/16/2021    [provider]  calcium-vitamin D (OSCAL WITH D) 250-125 MG-UNIT per tablet Take 1 tablet by mouth daily.    [provider]  cholecalciferol (VITAMIN D) 1000 UNITS tablet Take 1,000 Units by mouth once a week.    [provider]  hydrochlorothiazide (HYDRODIURIL) 25 MG tablet Take 25 mg by mouth daily.    [provider]  meperidine (DEMEROL) 50 MG tablet  03/27/14   [provider]  omeprazole (PRILOSEC) 20 MG capsule Take 20 mg by mouth daily.    [provider]  promethazine (PHENERGAN) 25 MG tablet  03/27/14   [provider]      Allergies    Penicillins, Red dye, and Yellow dyes (non-tartrazine)    Review of Systems   Review of Systems  Physical  Exam Updated Vital Signs BP (!) 186/104 (BP Location: Right Arm)   Pulse 70   Temp 98.4 F (36.9 C) (Oral)   Resp 18   SpO2 99%  Physical Exam Vitals and nursing note reviewed.  Constitutional:      Appearance: Normal appearance.  HENT:     Head: Normocephalic and atraumatic.  Eyes:     General: No scleral icterus.    Conjunctiva/sclera: Conjunctivae normal.  Pulmonary:     Effort: Pulmonary effort is normal. No respiratory distress.  Musculoskeletal:     Comments: Varicose veins in the bilateral lower extremities.  Right worse than left.  Strong DP pulses bilaterally.  No overlying cellulitis.  Notable swelling to the right ankle to around an inch above the bilateral malleoli  Skin:    Findings: No rash.  Neurological:     Mental Status: She is alert.  Psychiatric:        Mood and Affect: Mood normal.     ED Results / Procedures / Treatments   Labs (all labs ordered are listed, but only abnormal results are displayed) Labs Reviewed - No data to display  EKG None  Radiology US Venous Img Lower Right (DVT Study)  Result Date: 06/06/2022 CLINICAL DATA:  Medial ankle pain for 5 days.  Leg swelling. EXAM: RIGHT LOWER EXTREMITY VENOUS DOPPLER ULTRASOUND TECHNIQUE: Gray-scale sonography with compression,  as well as color and duplex ultrasound, were performed to evaluate the deep venous system(s) from the level of the common femoral vein through the popliteal and proximal calf veins. COMPARISON:  None Available. FINDINGS: VENOUS Nonocclusive thrombus noted the right common femoral vein and proximal femoral vein with decreased compressibility. Nonocclusive thrombus evident and the greater saphenous vein. There is similar appearing nonocclusive partly compressible thrombus in the left common femoral vein. Mid to distal right femoral vein, deep right femoral vein, popliteal and calf veins are patent and normally compressible. OTHER Complex popliteal fossa cyst measuring 5.1 cm in long  axis presumed to be a Baker cyst. Elongated fluid collection along the medial aspect of the ankle measuring 4.7 x 1.1 x 1.9 cm, nonspecific. Limitations: none IMPRESSION: 1. Probable chronic thrombosis in both common femoral veins and in the right proximal femoral vein and greater saphenous vein. Patient has a history of extensive bilateral leg DVT in the 1980s. 2. No other evidence of deep venous thrombosis. 3. 5.1 cm complex popliteal cyst. 4.7 cm fluid collection along the medial right ankle of unclear etiology. Electronically Signed   By: Amie Portland M.D.   On: 06/06/2022 15:05   DG Ankle Complete Right  Result Date: 06/06/2022 CLINICAL DATA:  Pain EXAM: RIGHT ANKLE - COMPLETE 3 VIEW COMPARISON:  Right foot radiograph dated April 30, 2014 FINDINGS: There is no evidence of fracture, dislocation, or joint effusion. Orthopedic hardware of the midfoot and first metatarsal and distal fifth metatarsal. Mild degenerative changes of the medial tibiotalar joint. Soft tissues are unremarkable. IMPRESSION: No acute displaced fracture or dislocation of the right ankle. Electronically Signed   By: Allegra Lai M.D.   On: 06/06/2022 12:58    Procedures Procedures   Medications Ordered in ED Medications - No data to display  ED Course/ Medical Decision Making/ A&P                             Medical Decision Making Amount and/or Complexity of Data Reviewed Radiology: ordered.  87 year old female presenting today with pain to the ankle.  Differential includes but is not limited to DVT, septic joint, fracture, dislocation, osteoarthritis.  This is not exhaustive.  Imaging: X-ray ordered and reviewed and interpreted by me.  I agree with radiology that there are no acute findings  DVT study: Chronic DVT  Dispo: 86 year old female with leg swelling.  Seen by me as well as my attending physician.  X-ray ordered in triage which was negative.  Neurovascularly intact.  DVT study showed a chronic DVT.  She  was not previously anticoagulated for this DVT that stretches back to 2006 due to intracranial hemorrhage.  Will not start anticoagulation at this time.  She will follow-up with vascular as well as her PCP.  She is agreeable to the plan and thankful to have an answer about her swelling.  Discharged in stable condition after consultation with attending physician   Final Clinical Impression(s) / ED Diagnoses Final diagnoses:  Chronic deep vein thrombosis (DVT) of proximal vein of right lower extremity (HCC)    Rx / DC Orders ED Discharge Orders     None      Results and diagnoses were explained to the patient. Return precautions discussed in full. Patient had no additional questions and expressed complete understanding.   This chart was dictated using voice recognition software.  Despite best efforts to proofread,  errors can occur which can change the  documentation meaning.    Woodroe Chen 06/06/22 1555    Glynn Octave, MD 06/06/22 [phone]

## 2022-06-06 NOTE — ED Notes (Signed)
Dc instructions reviewed with patient. Patient voiced understanding. Dc with belongings.  °

## 2022-06-06 NOTE — Discharge Instructions (Addendum)
You came to the emergency department today due to pain in your ankle.  Your x-ray was normal however your ultrasound showed a blood clot in this extremity.  It appears as though you have had this for a long time however now that it is swollen and bothering you I believe it is reasonable for you to follow-up with your PCP as well as vascular.  Call Dr. Clelia Croft for follow-up but also make an appointment with Dr. Myra Gianotti with the vascular team.  With any chest pain, palpitations, shortness of breath, lightheadedness or other concerns please do not hesitate to return to the nearest emergency department.  We hope you feel better!

## 2022-06-06 NOTE — ED Triage Notes (Addendum)
Pt presents to ED POV. Pt c/o R ankle swelling and pain. Pt reports that it began worsening Monday and reports it has worsened since. Ambulatory into triage. Pt reports bp always elevated in ER. Hx of arthritis and flare up in the same ankle

## 2022-06-15 DIAGNOSIS — M25571 Pain in right ankle and joints of right foot: Secondary | ICD-10-CM | POA: Diagnosis not present

## 2022-06-15 DIAGNOSIS — I82723 Chronic embolism and thrombosis of deep veins of upper extremity, bilateral: Secondary | ICD-10-CM | POA: Diagnosis not present

## 2022-06-15 DIAGNOSIS — Z95828 Presence of other vascular implants and grafts: Secondary | ICD-10-CM | POA: Diagnosis not present

## 2022-06-17 ENCOUNTER — Ambulatory Visit (INDEPENDENT_AMBULATORY_CARE_PROVIDER_SITE_OTHER): Payer: Medicare Other | Admitting: Vascular Surgery

## 2022-06-17 ENCOUNTER — Encounter: Payer: Self-pay | Admitting: Vascular Surgery

## 2022-06-17 VITALS — BP 118/68 | HR 77 | Temp 98.0°F | Resp 20 | Ht 64.0 in | Wt 143.0 lb

## 2022-06-17 DIAGNOSIS — I82723 Chronic embolism and thrombosis of deep veins of upper extremity, bilateral: Secondary | ICD-10-CM

## 2022-06-17 NOTE — Progress Notes (Signed)
Patient ID: Kathryn Mann, female   DOB: March 17, 1935, 87 y.o.   MRN: [id number]  Reason for Consult: No chief complaint on file.   Referred by Cleatis Polka., MD  Subjective:     HPI:  Kathryn Mann is a 87 y.o. female has a history of IVC filter placed in the 1980s which is a [place] filter.  Recently she had ankle pain with associated swelling around the right ankle but without any skin changes.  She was evaluated in the emergency department and ultrasound demonstrated venous occlusive disease.  Her pain subsequently resolved.  She is taking aspirin she does not remember if she was taking aspirin at the time.  She is not on any blood thinners.  She has worn compression stockings in the past but currently has no interest as they cause significant discomfort.  She does not really have any complaints today.  Past Medical History:  Diagnosis Date   GERD (gastroesophageal reflux disease)    Hypertension    Incarcerated hiatal hernia 02/09/2022   Open wound of left breast 07/04/2015   Presence of IVC filter 02/09/2022   Family History  Problem Relation Age of Onset   Cancer Father    Past Surgical History:  Procedure Laterality Date   ABDOMINAL HYSTERECTOMY     FOOT SURGERY     right foot surgery    Short Social History:  Social History   Tobacco Use   Smoking status: Never   Smokeless tobacco: Not on file  Substance Use Topics   Alcohol use: Yes    Comment: socially    Allergies  Allergen Reactions   Penicillins    Red Dye    Yellow Dyes (Non-Tartrazine)     Current Outpatient Medications  Medication Sig Dispense Refill   aspirin 81 MG tablet Take 81 mg by mouth daily. (Patient not taking: Reported on 06/16/2021)     calcium-vitamin D (OSCAL WITH D) 250-125 MG-UNIT per tablet Take 1 tablet by mouth daily.     cholecalciferol (VITAMIN D) 1000 UNITS tablet Take 1,000 Units by mouth once a week.     hydrochlorothiazide (HYDRODIURIL) 25 MG tablet Take 25 mg by  mouth daily.     meperidine (DEMEROL) 50 MG tablet  (Patient not taking: Reported on 06/16/2021)  0   omeprazole (PRILOSEC) 20 MG capsule Take 20 mg by mouth daily.     promethazine (PHENERGAN) 25 MG tablet  (Patient not taking: Reported on 06/16/2021)  0   No current facility-administered medications for this visit.    Review of Systems  Constitutional:  Constitutional negative. HENT: HENT negative.  Eyes: Eyes negative.  Respiratory: Respiratory negative.  Cardiovascular: Positive for leg swelling.  GI: Gastrointestinal negative.  Musculoskeletal: Positive for leg pain.  Neurological: Neurological negative. Hematologic: Hematologic/lymphatic negative.  Psychiatric: Psychiatric negative.        Objective:  Objective  Vitals:   06/17/22 1025  BP: 118/68  Pulse: 77  Resp: 20  Temp: 98 F (36.7 C)  SpO2: 96%      Physical Exam HENT:     Head: Normocephalic.     Nose: Nose normal.  Eyes:     Pupils: Pupils are equal, round, and reactive to light.  Cardiovascular:     Rate and Rhythm: Normal rate.     Pulses: Normal pulses.  Pulmonary:     Effort: Pulmonary effort is normal.  Abdominal:     General: Abdomen is flat.     Palpations:  Abdomen is soft.  Musculoskeletal:     Cervical back: Normal range of motion.     Right lower leg: Edema present.     Left lower leg: Edema present.  Skin:    Capillary Refill: Capillary refill takes less than 2 seconds.  Neurological:     General: No focal deficit present.     Mental Status: She is alert.  Psychiatric:        Mood and Affect: Mood normal.        Thought Content: Thought content normal.        Judgment: Judgment normal.     Data: Right lower extremity venous IMPRESSION: 1. Probable chronic thrombosis in both common femoral veins and in the right proximal femoral vein and greater saphenous vein. Patient has a history of extensive bilateral leg DVT in the 1980s. 2. No other evidence of deep venous  thrombosis. 3. 5.1 cm complex popliteal cyst. 4.7 cm fluid collection along the medial right ankle of unclear etiology.       Assessment/Plan:    87 year old female with over 40-year history of placement of IVC filter with known venous occlusive disease of her IVC and iliac veins dating back at least to a CT scan that was performed in 2023 that I reviewed today.  I discussed with the patient wearing compression stockings although she really does not want to do this.  Possibly her pain was caused from Baker's cyst as it is resolved now I recommend continuing aspirin she can see me on an as-needed basis.     Maeola Harman MD [facility]

## 2022-06-18 DIAGNOSIS — M1711 Unilateral primary osteoarthritis, right knee: Secondary | ICD-10-CM | POA: Diagnosis not present

## 2022-06-30 DIAGNOSIS — M25571 Pain in right ankle and joints of right foot: Secondary | ICD-10-CM | POA: Diagnosis not present

## 2022-06-30 DIAGNOSIS — Z95828 Presence of other vascular implants and grafts: Secondary | ICD-10-CM | POA: Diagnosis not present

## 2022-06-30 DIAGNOSIS — I82723 Chronic embolism and thrombosis of deep veins of upper extremity, bilateral: Secondary | ICD-10-CM | POA: Diagnosis not present

## 2022-06-30 DIAGNOSIS — E663 Overweight: Secondary | ICD-10-CM | POA: Diagnosis not present

## 2022-06-30 DIAGNOSIS — M5416 Radiculopathy, lumbar region: Secondary | ICD-10-CM | POA: Diagnosis not present

## 2022-06-30 DIAGNOSIS — M25561 Pain in right knee: Secondary | ICD-10-CM | POA: Diagnosis not present

## 2022-07-01 ENCOUNTER — Ambulatory Visit (HOSPITAL_COMMUNITY)
Admission: RE | Admit: 2022-07-01 | Discharge: 2022-07-01 | Disposition: A | Discharge status: Home / Self Care | Payer: Medicare Other | Source: Ambulatory Visit | Attending: Gastroenterology | Admitting: Gastroenterology

## 2022-07-01 ENCOUNTER — Encounter (HOSPITAL_COMMUNITY)
Admission: RE | Disposition: A | Discharge status: Home / Self Care | Payer: Self-pay | Source: Ambulatory Visit | Attending: Gastroenterology

## 2022-07-01 DIAGNOSIS — K449 Diaphragmatic hernia without obstruction or gangrene: Secondary | ICD-10-CM | POA: Insufficient documentation

## 2022-07-01 HISTORY — PX: ESOPHAGEAL MANOMETRY: SHX5429

## 2022-07-01 SURGERY — MANOMETRY, ESOPHAGUS

## 2022-07-01 MED ORDER — LIDOCAINE VISCOUS HCL 2 % MT SOLN
OROMUCOSAL | Status: AC
  Filled 2022-07-01: qty 15

## 2022-07-01 SURGICAL SUPPLY — 2 items
FACESHIELD LNG OPTICON STERILE (SAFETY) IMPLANT
GLOVE BIO SURGEON STRL SZ8 (GLOVE) ×2 IMPLANT

## 2022-07-01 NOTE — Progress Notes (Signed)
Esophageal Manometry done per protocol. Patient tolerated well without distress or complication.   

## 2022-07-05 ENCOUNTER — Encounter (HOSPITAL_COMMUNITY): Payer: Self-pay | Admitting: Gastroenterology

## 2022-07-11 DIAGNOSIS — K219 Gastro-esophageal reflux disease without esophagitis: Secondary | ICD-10-CM | POA: Diagnosis not present

## 2022-07-11 DIAGNOSIS — K449 Diaphragmatic hernia without obstruction or gangrene: Secondary | ICD-10-CM | POA: Diagnosis not present

## 2022-07-27 DIAGNOSIS — Z95828 Presence of other vascular implants and grafts: Secondary | ICD-10-CM | POA: Diagnosis not present

## 2022-07-27 DIAGNOSIS — I868 Varicose veins of other specified sites: Secondary | ICD-10-CM | POA: Diagnosis not present

## 2022-07-27 DIAGNOSIS — K440 Diaphragmatic hernia with obstruction, without gangrene: Secondary | ICD-10-CM | POA: Diagnosis not present

## 2022-07-27 DIAGNOSIS — K3189 Other diseases of stomach and duodenum: Secondary | ICD-10-CM | POA: Diagnosis not present

## 2022-08-13 DIAGNOSIS — M1711 Unilateral primary osteoarthritis, right knee: Secondary | ICD-10-CM | POA: Diagnosis not present

## 2022-08-17 DIAGNOSIS — N390 Urinary tract infection, site not specified: Secondary | ICD-10-CM | POA: Diagnosis not present

## 2022-08-26 DIAGNOSIS — H6123 Impacted cerumen, bilateral: Secondary | ICD-10-CM | POA: Diagnosis not present

## 2022-09-17 DIAGNOSIS — M1711 Unilateral primary osteoarthritis, right knee: Secondary | ICD-10-CM | POA: Diagnosis not present

## 2022-09-23 DIAGNOSIS — H353132 Nonexudative age-related macular degeneration, bilateral, intermediate dry stage: Secondary | ICD-10-CM | POA: Diagnosis not present

## 2022-09-23 DIAGNOSIS — H524 Presbyopia: Secondary | ICD-10-CM | POA: Diagnosis not present

## 2022-09-23 DIAGNOSIS — Z961 Presence of intraocular lens: Secondary | ICD-10-CM | POA: Diagnosis not present

## 2022-10-14 DIAGNOSIS — M5416 Radiculopathy, lumbar region: Secondary | ICD-10-CM | POA: Diagnosis not present

## 2022-10-14 DIAGNOSIS — M48061 Spinal stenosis, lumbar region without neurogenic claudication: Secondary | ICD-10-CM | POA: Diagnosis not present

## 2022-10-21 ENCOUNTER — Other Ambulatory Visit: Payer: Self-pay | Admitting: Neurological Surgery

## 2022-10-21 DIAGNOSIS — M5416 Radiculopathy, lumbar region: Secondary | ICD-10-CM

## 2022-10-28 NOTE — Discharge Instructions (Signed)

## 2022-10-29 ENCOUNTER — Ambulatory Visit
Admission: RE | Admit: 2022-10-29 | Discharge: 2022-10-29 | Disposition: A | Discharge status: Home / Self Care | Payer: Medicare Other | Source: Ambulatory Visit | Attending: Neurological Surgery | Admitting: Neurological Surgery

## 2022-10-29 DIAGNOSIS — M5416 Radiculopathy, lumbar region: Secondary | ICD-10-CM

## 2022-10-29 DIAGNOSIS — M4727 Other spondylosis with radiculopathy, lumbosacral region: Secondary | ICD-10-CM | POA: Diagnosis not present

## 2022-10-29 DIAGNOSIS — M48061 Spinal stenosis, lumbar region without neurogenic claudication: Secondary | ICD-10-CM | POA: Diagnosis not present

## 2022-10-29 MED ORDER — IOPAMIDOL (ISOVUE-M 200) INJECTION 41%
1.0000 mL | Freq: Once | INTRAMUSCULAR | Status: AC
  Administered 2022-10-29: 1 mL via EPIDURAL

## 2022-10-29 MED ORDER — METHYLPREDNISOLONE ACETATE 40 MG/ML INJ SUSP (RADIOLOG
80.0000 mg | Freq: Once | INTRAMUSCULAR | Status: AC
  Administered 2022-10-29: 80 mg via EPIDURAL

## 2022-12-18 DIAGNOSIS — M1711 Unilateral primary osteoarthritis, right knee: Secondary | ICD-10-CM | POA: Diagnosis not present

## 2023-02-05 DIAGNOSIS — M1711 Unilateral primary osteoarthritis, right knee: Secondary | ICD-10-CM | POA: Diagnosis not present

## 2023-02-08 DIAGNOSIS — E785 Hyperlipidemia, unspecified: Secondary | ICD-10-CM | POA: Diagnosis not present

## 2023-02-08 DIAGNOSIS — E559 Vitamin D deficiency, unspecified: Secondary | ICD-10-CM | POA: Diagnosis not present

## 2023-02-08 DIAGNOSIS — I10 Essential (primary) hypertension: Secondary | ICD-10-CM | POA: Diagnosis not present

## 2023-02-15 DIAGNOSIS — I868 Varicose veins of other specified sites: Secondary | ICD-10-CM | POA: Diagnosis not present

## 2023-02-15 DIAGNOSIS — K449 Diaphragmatic hernia without obstruction or gangrene: Secondary | ICD-10-CM | POA: Diagnosis not present

## 2023-02-15 DIAGNOSIS — I10 Essential (primary) hypertension: Secondary | ICD-10-CM | POA: Diagnosis not present

## 2023-02-15 DIAGNOSIS — Z1339 Encounter for screening examination for other mental health and behavioral disorders: Secondary | ICD-10-CM | POA: Diagnosis not present

## 2023-02-15 DIAGNOSIS — Z1331 Encounter for screening for depression: Secondary | ICD-10-CM | POA: Diagnosis not present

## 2023-02-15 DIAGNOSIS — I340 Nonrheumatic mitral (valve) insufficiency: Secondary | ICD-10-CM | POA: Diagnosis not present

## 2023-02-15 DIAGNOSIS — M8580 Other specified disorders of bone density and structure, unspecified site: Secondary | ICD-10-CM | POA: Diagnosis not present

## 2023-02-15 DIAGNOSIS — I82723 Chronic embolism and thrombosis of deep veins of upper extremity, bilateral: Secondary | ICD-10-CM | POA: Diagnosis not present

## 2023-02-15 DIAGNOSIS — M25512 Pain in left shoulder: Secondary | ICD-10-CM | POA: Diagnosis not present

## 2023-02-15 DIAGNOSIS — I77810 Thoracic aortic ectasia: Secondary | ICD-10-CM | POA: Diagnosis not present

## 2023-02-15 DIAGNOSIS — Z95828 Presence of other vascular implants and grafts: Secondary | ICD-10-CM | POA: Diagnosis not present

## 2023-02-15 DIAGNOSIS — E785 Hyperlipidemia, unspecified: Secondary | ICD-10-CM | POA: Diagnosis not present

## 2023-02-15 DIAGNOSIS — I700 Atherosclerosis of aorta: Secondary | ICD-10-CM | POA: Diagnosis not present

## 2023-02-15 DIAGNOSIS — Z0000 Encounter for general adult medical examination without abnormal findings: Secondary | ICD-10-CM | POA: Diagnosis not present

## 2023-02-23 DIAGNOSIS — Z23 Encounter for immunization: Secondary | ICD-10-CM | POA: Diagnosis not present

## 2023-04-02 DIAGNOSIS — M1711 Unilateral primary osteoarthritis, right knee: Secondary | ICD-10-CM | POA: Diagnosis not present

## 2023-05-21 ENCOUNTER — Ambulatory Visit (HOSPITAL_COMMUNITY)
Admission: RE | Admit: 2023-05-21 | Discharge: 2023-05-21 | Disposition: A | Discharge status: Home / Self Care | Source: Ambulatory Visit | Attending: Cardiovascular Disease | Admitting: Cardiovascular Disease

## 2023-05-21 ENCOUNTER — Other Ambulatory Visit (HOSPITAL_COMMUNITY): Payer: Self-pay | Admitting: Orthopedic Surgery

## 2023-05-21 DIAGNOSIS — M79604 Pain in right leg: Secondary | ICD-10-CM | POA: Diagnosis not present

## 2023-05-21 DIAGNOSIS — Z86718 Personal history of other venous thrombosis and embolism: Secondary | ICD-10-CM | POA: Diagnosis not present

## 2023-05-21 DIAGNOSIS — R2241 Localized swelling, mass and lump, right lower limb: Secondary | ICD-10-CM | POA: Diagnosis not present

## 2023-05-21 DIAGNOSIS — M1711 Unilateral primary osteoarthritis, right knee: Secondary | ICD-10-CM | POA: Diagnosis not present

## 2023-05-21 DIAGNOSIS — M79661 Pain in right lower leg: Secondary | ICD-10-CM | POA: Diagnosis not present

## 2023-06-22 DIAGNOSIS — M5416 Radiculopathy, lumbar region: Secondary | ICD-10-CM | POA: Diagnosis not present

## 2023-06-22 DIAGNOSIS — M4316 Spondylolisthesis, lumbar region: Secondary | ICD-10-CM | POA: Diagnosis not present

## 2023-07-07 ENCOUNTER — Ambulatory Visit: Attending: Vascular Surgery | Admitting: Vascular Surgery

## 2023-07-07 ENCOUNTER — Encounter: Payer: Self-pay | Admitting: Vascular Surgery

## 2023-07-07 VITALS — BP 142/88 | HR 76 | Temp 98.3°F | Ht 64.0 in | Wt 140.0 lb

## 2023-07-07 DIAGNOSIS — M7989 Other specified soft tissue disorders: Secondary | ICD-10-CM

## 2023-07-07 DIAGNOSIS — I82511 Chronic embolism and thrombosis of right femoral vein: Secondary | ICD-10-CM

## 2023-07-07 NOTE — Progress Notes (Signed)
 Patient ID: Kathryn Mann, female   DOB: Jan 04, 1936, 88 y.o.   MRN: [id number]  Reason for Consult: Follow-up   Referred by Jeannine Milroy., MD  Subjective:     HPI:  Kathryn Mann is a 88 y.o. female has history of [place] filter placed in the 1980s was never removed.  She does take aspirin does not take any blood thinners does not remember being on blood thinners.  She recently had pain in her right leg was found to have chronic DVT where she believes she did have thrombosis in the past.  She does not have any access swelling but does have persistent swelling in her lower extremities.  She is planning for travel to [place] in [place] in the near future.  Past Medical History:  Diagnosis Date   GERD (gastroesophageal reflux disease)    Hypertension    Incarcerated hiatal hernia 02/09/2022   Open wound of left breast 07/04/2015   Presence of IVC filter 02/09/2022   Family History  Problem Relation Age of Onset   Cancer Father    Past Surgical History:  Procedure Laterality Date   ABDOMINAL HYSTERECTOMY     ESOPHAGEAL MANOMETRY N/A 07/01/2022   Procedure: ESOPHAGEAL MANOMETRY (EM);  Surgeon: Baldo Bonds, MD;  Location: [facility];  Service: Gastroenterology;  Laterality: N/A;  Per request of Dr. Hershell Lose   FOOT SURGERY     right foot surgery    Short Social History:  Social History   Tobacco Use   Smoking status: Former    Types: Cigarettes   Smokeless tobacco: Not on file   Tobacco comments:    Smoked 1 cigarette after dessert only for about 2 years in college  Substance Use Topics   Alcohol  use: Yes    Comment: socially    Allergies  Allergen Reactions   Penicillins    Red Dye #40 (Allura Red)    Yellow Dyes (Non-Tartrazine)     Current Outpatient Medications  Medication Sig Dispense Refill   aspirin 81 MG tablet Take 81 mg by mouth daily.     calcium-vitamin D (OSCAL WITH D) 250-125 MG-UNIT per tablet Take 1 tablet by mouth daily.      cholecalciferol (VITAMIN D) 1000 UNITS tablet Take 1,000 Units by mouth once a week.     hydrochlorothiazide (HYDRODIURIL) 25 MG tablet Take 25 mg by mouth daily.     KLOR-CON M20 20 MEQ tablet TAKE 1 TABLET BY MOUTH EVERY DAY for 90     Multiple Vitamins-Minerals (PRESERVISION AREDS 2) CAPS Take by mouth.     omeprazole (PRILOSEC) 20 MG capsule Take 20 mg by mouth daily.     No current facility-administered medications for this visit.    Review of Systems  Constitutional:  Constitutional negative. HENT: HENT negative.  Eyes: Eyes negative.  Respiratory: Respiratory negative.  Cardiovascular: Positive for leg swelling.  GI: Gastrointestinal negative.  Musculoskeletal: Positive for leg pain.  Skin: Skin negative.  Neurological: Neurological negative. Hematologic: Hematologic/lymphatic negative.  Psychiatric: Psychiatric negative.        Objective:  Objective   Vitals:   07/07/23 0955  BP: (!) 142/88  Pulse: 76  Temp: 98.3 F (36.8 C)  SpO2: 93%  Weight: 140 lb (63.5 kg)  Height: 5\' 4"  (1.626 m)   Body mass index is 24.03 kg/m.  Physical Exam HENT:     Head: Normocephalic.     Nose: Nose normal.  Eyes:     Pupils: Pupils are equal,  round, and reactive to light.  Cardiovascular:     Rate and Rhythm: Normal rate.     Pulses: Normal pulses.  Pulmonary:     Effort: Pulmonary effort is normal.  Abdominal:     General: Abdomen is flat.  Musculoskeletal:        General: Normal range of motion.     Right lower leg: Edema present.     Left lower leg: Edema present.  Skin:    General: Skin is warm and dry.     Capillary Refill: Capillary refill takes less than 2 seconds.  Neurological:     General: No focal deficit present.     Mental Status: She is alert.  Psychiatric:        Mood and Affect: Mood normal.        Thought Content: Thought content normal.        Judgment: Judgment normal.     Data: RIGHT    CompressibilityPhasicitySpontaneityProperties      Thrombus                                                                   Aging           +---------+---------------+---------+-----------+---------------+----------  ---+  CFV     Full           Yes      Yes                                       +---------+---------------+---------+-----------+---------------+----------  ---+  SFJ     Full                    Yes                                       +---------+---------------+---------+-----------+---------------+----------  ---+  FV Prox  Full           Yes      Yes                                       +---------+---------------+---------+-----------+---------------+----------  ---+  FV Mid   Partial        Yes      Yes        brightly       Chronic                                                     echogenic                      +---------+---------------+---------+-----------+---------------+----------  ---+  FV DistalPartial        Yes      Yes        brightly       Chronic  echogenic                      +---------+---------------+---------+-----------+---------------+----------  ---+  PFV     Full                    Yes                                       +---------+---------------+---------+-----------+---------------+----------  ---+  POP     Full           Yes      Yes                                       +---------+---------------+---------+-----------+---------------+----------  ---+  PTV     Full                                                              +---------+---------------+---------+-----------+---------------+----------  ---+  PERO    Partial                            brightly       Chronic                                                     echogenic                      +---------+---------------+---------+-----------+---------------+----------   ---+  Gastroc Full                                                              +---------+---------------+---------+-----------+---------------+----------  ---+  GSV     Partial        Yes      Yes        brightly       Chronic                                                     echogenic                      +---------+---------------+---------+-----------+---------------+----------  ---+         +----+---------------+---------+-----------+----------+--------------+  LEFTCompressibilityPhasicitySpontaneityPropertiesThrombus Aging  +----+---------------+---------+-----------+----------+--------------+  CFV Full           Yes      Yes                                  +----+---------------+---------+-----------+----------+--------------+  Summary:  RIGHT:  - Findings consistent with chronic deep vein thrombosis involving the  right femoral vein, and right peroneal veins.  - Findings consistent with chronic superficial vein thrombosis involving  the right great saphenous vein.  - A cystic structure is found in the popliteal fossa.  - Venous insuffiencey noted in the right distal femoral vein.  - All other veins visualized appear fully compressible and demonstrate  appropriate Doppler characteristics.  - Ultrasound characteristics of a ruptured Baker's Cyst are noted.      Assessment/Plan:    88 year old female recently had swelling and pain of the right lower extremity found to have chronic venous thrombosis which has likely been present for many years.  I have recommended gentle compression stockings and we will fit her for those today.  She should otherwise were resume normal activity and when she is flying would be beneficial to take frequent breaks to walk and remaining hydrated.  She should otherwise be okay with aspirin as she has no recent history of thrombosis.     Adine Hoof MD Vascular and Vein  [facility]

## 2023-09-28 DIAGNOSIS — H6123 Impacted cerumen, bilateral: Secondary | ICD-10-CM | POA: Diagnosis not present

## 2023-10-21 DIAGNOSIS — M4316 Spondylolisthesis, lumbar region: Secondary | ICD-10-CM | POA: Diagnosis not present

## 2023-11-04 DIAGNOSIS — Z23 Encounter for immunization: Secondary | ICD-10-CM | POA: Diagnosis not present

## 2023-12-21 DIAGNOSIS — H26493 Other secondary cataract, bilateral: Secondary | ICD-10-CM | POA: Diagnosis not present

## 2023-12-21 DIAGNOSIS — Z961 Presence of intraocular lens: Secondary | ICD-10-CM | POA: Diagnosis not present

## 2023-12-21 DIAGNOSIS — H353132 Nonexudative age-related macular degeneration, bilateral, intermediate dry stage: Secondary | ICD-10-CM | POA: Diagnosis not present

## 2023-12-21 DIAGNOSIS — H524 Presbyopia: Secondary | ICD-10-CM | POA: Diagnosis not present
